# Patient Record
Sex: Male | Born: 1955 | Race: Black or African American | Hispanic: No | Marital: Single | Smoking: Former smoker
Health system: Southern US, Community
[De-identification: ages and names within clinical notes are randomized; demographics above are authoritative.]

## PROBLEM LIST (undated history)

## (undated) DIAGNOSIS — I10 Essential (primary) hypertension: Secondary | ICD-10-CM

## (undated) DIAGNOSIS — E785 Hyperlipidemia, unspecified: Secondary | ICD-10-CM

## (undated) DIAGNOSIS — K611 Rectal abscess: Secondary | ICD-10-CM

## (undated) DIAGNOSIS — Z86010 Personal history of colonic polyps: Secondary | ICD-10-CM

## (undated) DIAGNOSIS — S62509A Fracture of unspecified phalanx of unspecified thumb, initial encounter for closed fracture: Secondary | ICD-10-CM

## (undated) DIAGNOSIS — I2699 Other pulmonary embolism without acute cor pulmonale: Secondary | ICD-10-CM

## (undated) HISTORY — DX: Rectal abscess: K61.1

## (undated) HISTORY — PX: DENTAL RESTORATION/EXTRACTION WITH X-RAY: SHX5796

## (undated) HISTORY — DX: Essential (primary) hypertension: I10

## (undated) HISTORY — DX: Hyperlipidemia, unspecified: E78.5

## (undated) HISTORY — PX: ORIF FINGER / THUMB FRACTURE: SUR932

## (undated) HISTORY — DX: Personal history of colonic polyps: Z86.010

## (undated) HISTORY — DX: Fracture of unspecified phalanx of unspecified thumb, initial encounter for closed fracture: S62.509A

## (undated) HISTORY — DX: Other pulmonary embolism without acute cor pulmonale: I26.99

---

## 1998-12-27 ENCOUNTER — Encounter: Payer: Self-pay | Admitting: Family Medicine

## 2005-01-04 ENCOUNTER — Ambulatory Visit: Payer: Self-pay | Admitting: Family Medicine

## 2005-07-03 ENCOUNTER — Ambulatory Visit: Payer: Self-pay | Admitting: Family Medicine

## 2005-07-23 ENCOUNTER — Ambulatory Visit: Payer: Self-pay | Admitting: Internal Medicine

## 2005-10-02 ENCOUNTER — Ambulatory Visit: Payer: Self-pay | Admitting: Family Medicine

## 2005-11-13 ENCOUNTER — Ambulatory Visit: Payer: Self-pay | Admitting: Family Medicine

## 2006-07-04 ENCOUNTER — Ambulatory Visit: Payer: Self-pay | Admitting: Family Medicine

## 2006-07-04 LAB — CONVERTED CEMR LAB: PSA: 0.44 ng/mL

## 2006-07-08 ENCOUNTER — Ambulatory Visit: Payer: Self-pay | Admitting: Family Medicine

## 2006-08-01 ENCOUNTER — Ambulatory Visit: Payer: Self-pay | Admitting: Internal Medicine

## 2006-08-23 ENCOUNTER — Ambulatory Visit: Payer: Self-pay | Admitting: Family Medicine

## 2006-09-26 ENCOUNTER — Ambulatory Visit: Payer: Self-pay | Admitting: Internal Medicine

## 2006-09-30 ENCOUNTER — Ambulatory Visit: Payer: Self-pay | Admitting: Family Medicine

## 2006-11-27 ENCOUNTER — Ambulatory Visit: Payer: Self-pay | Admitting: Family Medicine

## 2006-12-03 ENCOUNTER — Ambulatory Visit: Payer: Self-pay | Admitting: Family Medicine

## 2007-03-04 ENCOUNTER — Ambulatory Visit: Payer: Self-pay | Admitting: Family Medicine

## 2007-04-02 ENCOUNTER — Encounter: Payer: Self-pay | Admitting: Family Medicine

## 2007-04-02 DIAGNOSIS — N400 Enlarged prostate without lower urinary tract symptoms: Secondary | ICD-10-CM

## 2007-04-02 DIAGNOSIS — I10 Essential (primary) hypertension: Secondary | ICD-10-CM | POA: Insufficient documentation

## 2007-04-02 DIAGNOSIS — N411 Chronic prostatitis: Secondary | ICD-10-CM

## 2007-04-02 DIAGNOSIS — E785 Hyperlipidemia, unspecified: Secondary | ICD-10-CM

## 2007-04-10 ENCOUNTER — Ambulatory Visit: Payer: Self-pay | Admitting: Family Medicine

## 2007-04-22 ENCOUNTER — Telehealth (INDEPENDENT_AMBULATORY_CARE_PROVIDER_SITE_OTHER): Payer: Self-pay | Admitting: *Deleted

## 2007-04-25 ENCOUNTER — Ambulatory Visit: Payer: Self-pay | Admitting: Family Medicine

## 2007-07-16 ENCOUNTER — Ambulatory Visit: Payer: Self-pay | Admitting: Family Medicine

## 2007-07-16 LAB — CONVERTED CEMR LAB
AST: 20 units/L (ref 0–37)
Albumin: 3.6 g/dL (ref 3.5–5.2)
Alkaline Phosphatase: 63 units/L (ref 39–117)
BUN: 10 mg/dL (ref 6–23)
CO2: 31 meq/L (ref 19–32)
Cholesterol: 190 mg/dL (ref 0–200)
Creatinine, Ser: 1 mg/dL (ref 0.4–1.5)
GFR calc Af Amer: 101 mL/min
GFR calc non Af Amer: 83 mL/min
Glucose, Bld: 97 mg/dL (ref 70–99)
HDL: 53.9 mg/dL (ref >39.0)
PSA: 0.67 ng/mL (ref 0.10–4.00)
Potassium: 3.9 meq/L (ref 3.5–5.1)
TSH: 1.06 microintl units/mL (ref 0.35–5.50)
Total Protein: 6.8 g/dL (ref 6.0–8.3)
VLDL: 35 mg/dL (ref 0–40)

## 2007-07-18 ENCOUNTER — Ambulatory Visit: Payer: Self-pay | Admitting: Family Medicine

## 2007-07-18 DIAGNOSIS — N529 Male erectile dysfunction, unspecified: Secondary | ICD-10-CM

## 2007-10-21 ENCOUNTER — Ambulatory Visit: Payer: Self-pay | Admitting: Family Medicine

## 2007-11-13 ENCOUNTER — Ambulatory Visit: Payer: Self-pay | Admitting: Family Medicine

## 2007-11-27 DIAGNOSIS — I2699 Other pulmonary embolism without acute cor pulmonale: Secondary | ICD-10-CM

## 2007-11-27 HISTORY — DX: Other pulmonary embolism without acute cor pulmonale: I26.99

## 2007-12-18 ENCOUNTER — Ambulatory Visit: Payer: Self-pay | Admitting: Family Medicine

## 2008-03-16 ENCOUNTER — Ambulatory Visit: Payer: Self-pay | Admitting: Family Medicine

## 2008-06-24 ENCOUNTER — Ambulatory Visit: Payer: Self-pay | Admitting: Family Medicine

## 2008-06-24 LAB — CONVERTED CEMR LAB
Basophils Relative: 0.3 % (ref 0.0–3.0)
Chloride: 101 meq/L (ref 96–112)
Cholesterol: 210 mg/dL (ref 0–200)
Direct LDL: 120.3 mg/dL
Eosinophils Absolute: 0.1 10*3/uL (ref 0.0–0.7)
HCT: 40.3 % (ref 39.0–52.0)
HDL: 55.2 mg/dL (ref >39.0)
Hemoglobin: 13.9 g/dL (ref 13.0–17.0)
Lymphocytes Relative: 32.4 % (ref 12.0–46.0)
MCHC: 34.6 g/dL (ref 30.0–36.0)
Monocytes Relative: 8 % (ref 3.0–12.0)
Platelets: 187 10*3/uL (ref 150–400)
RDW: 13.5 % (ref 11.5–14.6)
Total Bilirubin: 0.7 mg/dL (ref 0.3–1.2)
Triglycerides: 164 mg/dL — ABNORMAL HIGH (ref 0–149)

## 2008-07-20 ENCOUNTER — Ambulatory Visit: Payer: Self-pay | Admitting: Family Medicine

## 2008-07-20 DIAGNOSIS — R7303 Prediabetes: Secondary | ICD-10-CM | POA: Insufficient documentation

## 2008-08-05 ENCOUNTER — Ambulatory Visit: Payer: Self-pay | Admitting: Family Medicine

## 2008-08-05 LAB — CONVERTED CEMR LAB: Glucose, Bld: 101 mg/dL — ABNORMAL HIGH (ref 70–99)

## 2008-08-12 ENCOUNTER — Ambulatory Visit: Payer: Self-pay | Admitting: Family Medicine

## 2008-08-12 LAB — CONVERTED CEMR LAB: OCCULT 1: NEGATIVE

## 2008-08-16 ENCOUNTER — Encounter (INDEPENDENT_AMBULATORY_CARE_PROVIDER_SITE_OTHER): Payer: Self-pay | Admitting: *Deleted

## 2008-10-03 ENCOUNTER — Emergency Department (HOSPITAL_COMMUNITY): Admission: EM | Admit: 2008-10-03 | Discharge: 2008-10-03 | Payer: Self-pay | Admitting: Emergency Medicine

## 2008-10-13 ENCOUNTER — Ambulatory Visit: Payer: Self-pay | Admitting: Family Medicine

## 2008-10-13 ENCOUNTER — Ambulatory Visit: Payer: Self-pay

## 2008-10-13 ENCOUNTER — Ambulatory Visit: Payer: Self-pay | Admitting: Internal Medicine

## 2008-10-13 DIAGNOSIS — R609 Edema, unspecified: Secondary | ICD-10-CM

## 2008-10-13 DIAGNOSIS — R600 Localized edema: Secondary | ICD-10-CM | POA: Insufficient documentation

## 2008-10-13 DIAGNOSIS — Z86718 Personal history of other venous thrombosis and embolism: Secondary | ICD-10-CM | POA: Insufficient documentation

## 2008-10-13 DIAGNOSIS — Z86711 Personal history of pulmonary embolism: Secondary | ICD-10-CM

## 2008-10-14 ENCOUNTER — Ambulatory Visit: Payer: Self-pay | Admitting: Internal Medicine

## 2008-10-14 ENCOUNTER — Inpatient Hospital Stay (HOSPITAL_COMMUNITY): Admission: EM | Admit: 2008-10-14 | Discharge: 2008-10-21 | Payer: Self-pay | Admitting: Emergency Medicine

## 2008-10-14 ENCOUNTER — Encounter (INDEPENDENT_AMBULATORY_CARE_PROVIDER_SITE_OTHER): Payer: Self-pay | Admitting: Internal Medicine

## 2008-10-21 ENCOUNTER — Encounter: Payer: Self-pay | Admitting: Internal Medicine

## 2008-10-21 ENCOUNTER — Encounter: Payer: Self-pay | Admitting: Family Medicine

## 2008-10-25 ENCOUNTER — Ambulatory Visit: Payer: Self-pay | Admitting: Family Medicine

## 2008-10-25 LAB — CONVERTED CEMR LAB
INR: 2.2
Prothrombin Time: 18.1 s

## 2008-10-29 ENCOUNTER — Ambulatory Visit: Payer: Self-pay | Admitting: Internal Medicine

## 2008-11-02 ENCOUNTER — Ambulatory Visit: Payer: Self-pay | Admitting: Family Medicine

## 2008-11-02 LAB — CONVERTED CEMR LAB: INR: 2.9

## 2008-11-09 ENCOUNTER — Ambulatory Visit: Payer: Self-pay | Admitting: Family Medicine

## 2008-11-09 LAB — CONVERTED CEMR LAB: Prothrombin Time: 20.4 s

## 2008-11-23 ENCOUNTER — Ambulatory Visit: Payer: Self-pay | Admitting: Family Medicine

## 2008-11-23 LAB — CONVERTED CEMR LAB: Prothrombin Time: 19 s

## 2008-11-30 ENCOUNTER — Ambulatory Visit: Payer: Self-pay | Admitting: Family Medicine

## 2008-12-21 ENCOUNTER — Ambulatory Visit: Payer: Self-pay | Admitting: Family Medicine

## 2008-12-21 LAB — CONVERTED CEMR LAB
INR: 3
Prothrombin Time: 20.9 s

## 2009-01-18 ENCOUNTER — Ambulatory Visit: Payer: Self-pay | Admitting: Family Medicine

## 2009-01-18 LAB — CONVERTED CEMR LAB: INR: 2.6

## 2009-02-15 ENCOUNTER — Ambulatory Visit: Payer: Self-pay | Admitting: Family Medicine

## 2009-02-15 LAB — CONVERTED CEMR LAB
INR: 3.1
Prothrombin Time: 21.1 s

## 2009-03-01 ENCOUNTER — Encounter: Payer: Self-pay | Admitting: Family Medicine

## 2009-03-15 ENCOUNTER — Ambulatory Visit: Payer: Self-pay | Admitting: Family Medicine

## 2009-03-22 ENCOUNTER — Ambulatory Visit: Payer: Self-pay | Admitting: Family Medicine

## 2009-03-22 LAB — CONVERTED CEMR LAB: Prothrombin Time: 17.3 s

## 2009-03-29 ENCOUNTER — Encounter: Payer: Self-pay | Admitting: Family Medicine

## 2009-03-29 ENCOUNTER — Ambulatory Visit: Payer: Self-pay

## 2009-03-29 HISTORY — PX: OTHER SURGICAL HISTORY: SHX169

## 2009-04-04 ENCOUNTER — Telehealth: Payer: Self-pay | Admitting: Family Medicine

## 2009-04-12 ENCOUNTER — Ambulatory Visit: Payer: Self-pay | Admitting: Family Medicine

## 2009-05-10 ENCOUNTER — Ambulatory Visit: Payer: Self-pay | Admitting: Family Medicine

## 2009-05-10 LAB — CONVERTED CEMR LAB
INR: 3.8
Prothrombin Time: 23.6 s

## 2009-05-24 ENCOUNTER — Ambulatory Visit: Payer: Self-pay | Admitting: Family Medicine

## 2009-05-24 LAB — CONVERTED CEMR LAB: Prothrombin Time: 18.7 s

## 2009-06-07 ENCOUNTER — Ambulatory Visit: Payer: Self-pay | Admitting: Family Medicine

## 2009-06-07 LAB — CONVERTED CEMR LAB
INR: 3 — ABNORMAL HIGH (ref 0.8–1.0)
Prothrombin Time: 20.1 s
Prothrombin Time: 30.2 s — ABNORMAL HIGH (ref 10.9–13.3)

## 2009-07-06 ENCOUNTER — Ambulatory Visit: Payer: Self-pay | Admitting: Family Medicine

## 2009-07-06 LAB — CONVERTED CEMR LAB
INR: 3
Prothrombin Time: 20.8 s

## 2009-07-20 ENCOUNTER — Ambulatory Visit: Payer: Self-pay | Admitting: Family Medicine

## 2009-07-20 LAB — CONVERTED CEMR LAB
AST: 15 units/L (ref 0–37)
Albumin: 3.8 g/dL (ref 3.5–5.2)
Alkaline Phosphatase: 58 units/L (ref 39–117)
Basophils Relative: 0.2 % (ref 0.0–3.0)
Bilirubin, Direct: 0 mg/dL (ref 0.0–0.3)
CO2: 30 meq/L (ref 19–32)
Calcium: 8.7 mg/dL (ref 8.4–10.5)
Creatinine, Ser: 0.8 mg/dL (ref 0.4–1.5)
Creatinine,U: 97.9 mg/dL
Glucose, Bld: 94 mg/dL (ref 70–99)
HDL: 65.4 mg/dL (ref >39.00)
LDL Cholesterol: 100 mg/dL — ABNORMAL HIGH (ref 0–99)
Lymphocytes Relative: 37.3 % (ref 12.0–46.0)
Lymphs Abs: 2.6 10*3/uL (ref 0.7–4.0)
MCHC: 35 g/dL (ref 30.0–36.0)
Microalb Creat Ratio: 2 mg/g (ref 0.0–30.0)
Microalb, Ur: 0.2 mg/dL (ref 0.0–1.9)
Monocytes Relative: 8 % (ref 3.0–12.0)
Neutro Abs: 3.7 10*3/uL (ref 1.4–7.7)
Potassium: 4 meq/L (ref 3.5–5.1)
Total CHOL/HDL Ratio: 3
Total Protein: 7.4 g/dL (ref 6.0–8.3)

## 2009-07-27 ENCOUNTER — Ambulatory Visit: Payer: Self-pay | Admitting: Family Medicine

## 2009-07-27 LAB — CONVERTED CEMR LAB
INR: 1.7
Prothrombin Time: 16.2 s

## 2009-08-09 ENCOUNTER — Ambulatory Visit: Payer: Self-pay | Admitting: Family Medicine

## 2009-08-09 LAB — CONVERTED CEMR LAB
INR: 2.7
Prothrombin Time: 19.9 s

## 2009-09-06 ENCOUNTER — Ambulatory Visit: Payer: Self-pay | Admitting: Family Medicine

## 2009-09-06 LAB — CONVERTED CEMR LAB: Prothrombin Time: 19.5 s

## 2009-09-20 ENCOUNTER — Ambulatory Visit: Payer: Self-pay | Admitting: Family Medicine

## 2009-09-20 LAB — CONVERTED CEMR LAB: Prothrombin Time: 17.9 s

## 2009-10-19 ENCOUNTER — Ambulatory Visit: Payer: Self-pay | Admitting: Family Medicine

## 2009-10-19 LAB — CONVERTED CEMR LAB
OCCULT 2: NEGATIVE
OCCULT 3: NEGATIVE

## 2009-10-25 ENCOUNTER — Ambulatory Visit: Payer: Self-pay | Admitting: Family Medicine

## 2009-10-26 HISTORY — PX: OTHER SURGICAL HISTORY: SHX169

## 2009-11-08 ENCOUNTER — Ambulatory Visit: Payer: Self-pay | Admitting: Family Medicine

## 2009-11-10 ENCOUNTER — Encounter: Payer: Self-pay | Admitting: Family Medicine

## 2009-11-11 ENCOUNTER — Encounter: Payer: Self-pay | Admitting: Family Medicine

## 2009-11-11 ENCOUNTER — Ambulatory Visit: Payer: Self-pay

## 2009-11-16 ENCOUNTER — Telehealth: Payer: Self-pay | Admitting: Family Medicine

## 2010-02-01 ENCOUNTER — Ambulatory Visit: Payer: Self-pay | Admitting: Family Medicine

## 2010-02-02 ENCOUNTER — Encounter: Payer: Self-pay | Admitting: Family Medicine

## 2010-03-14 ENCOUNTER — Encounter: Payer: Self-pay | Admitting: Cardiovascular Disease

## 2010-06-01 IMAGING — CR DG CHEST 2V
2 series · 2 of 2 positions shown · non-contrast
Comparison: None

CLINICAL DATA: Fever, shortness of breath and cough.  Rectal
bleeding.

CHEST - 2 VIEW

[w chest pa]
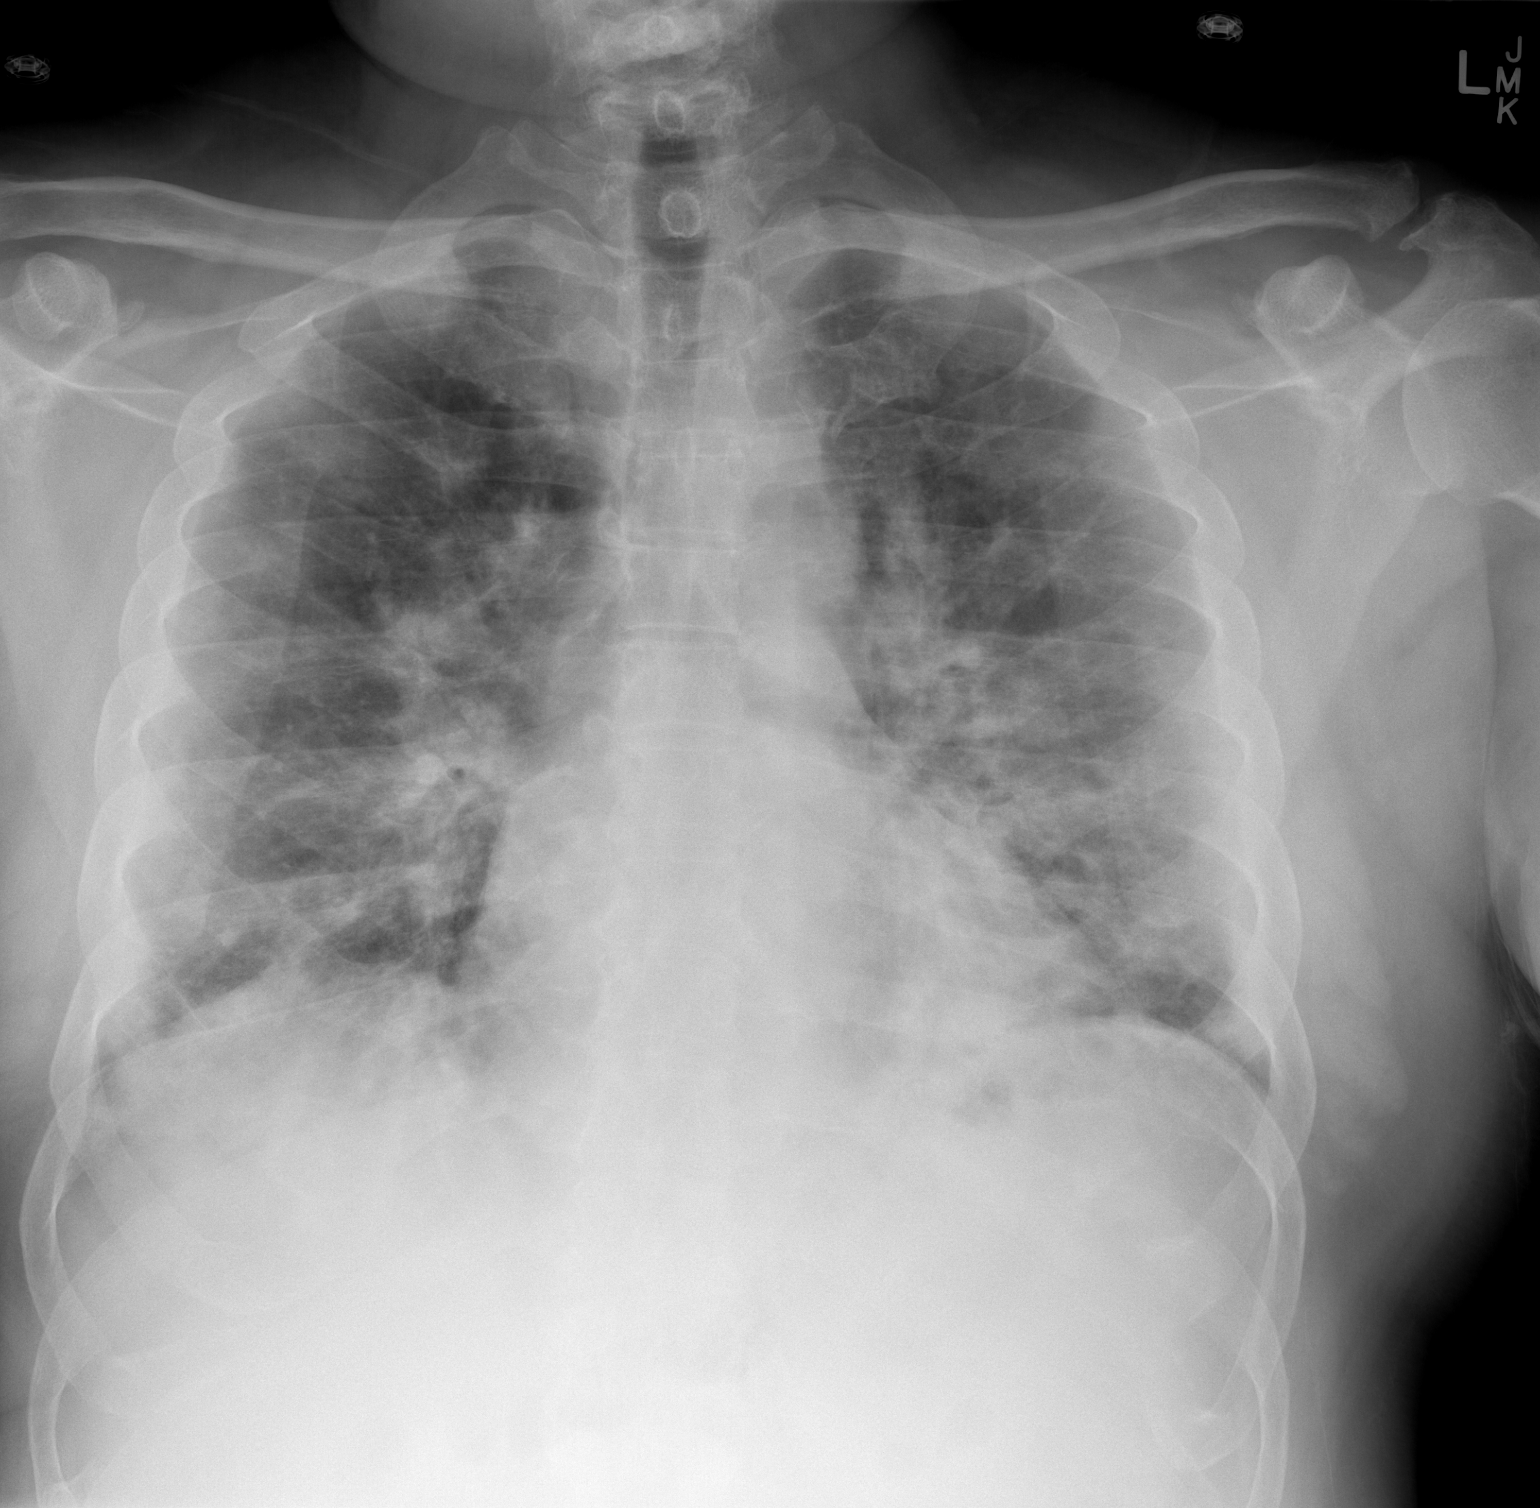

[w chest lat]
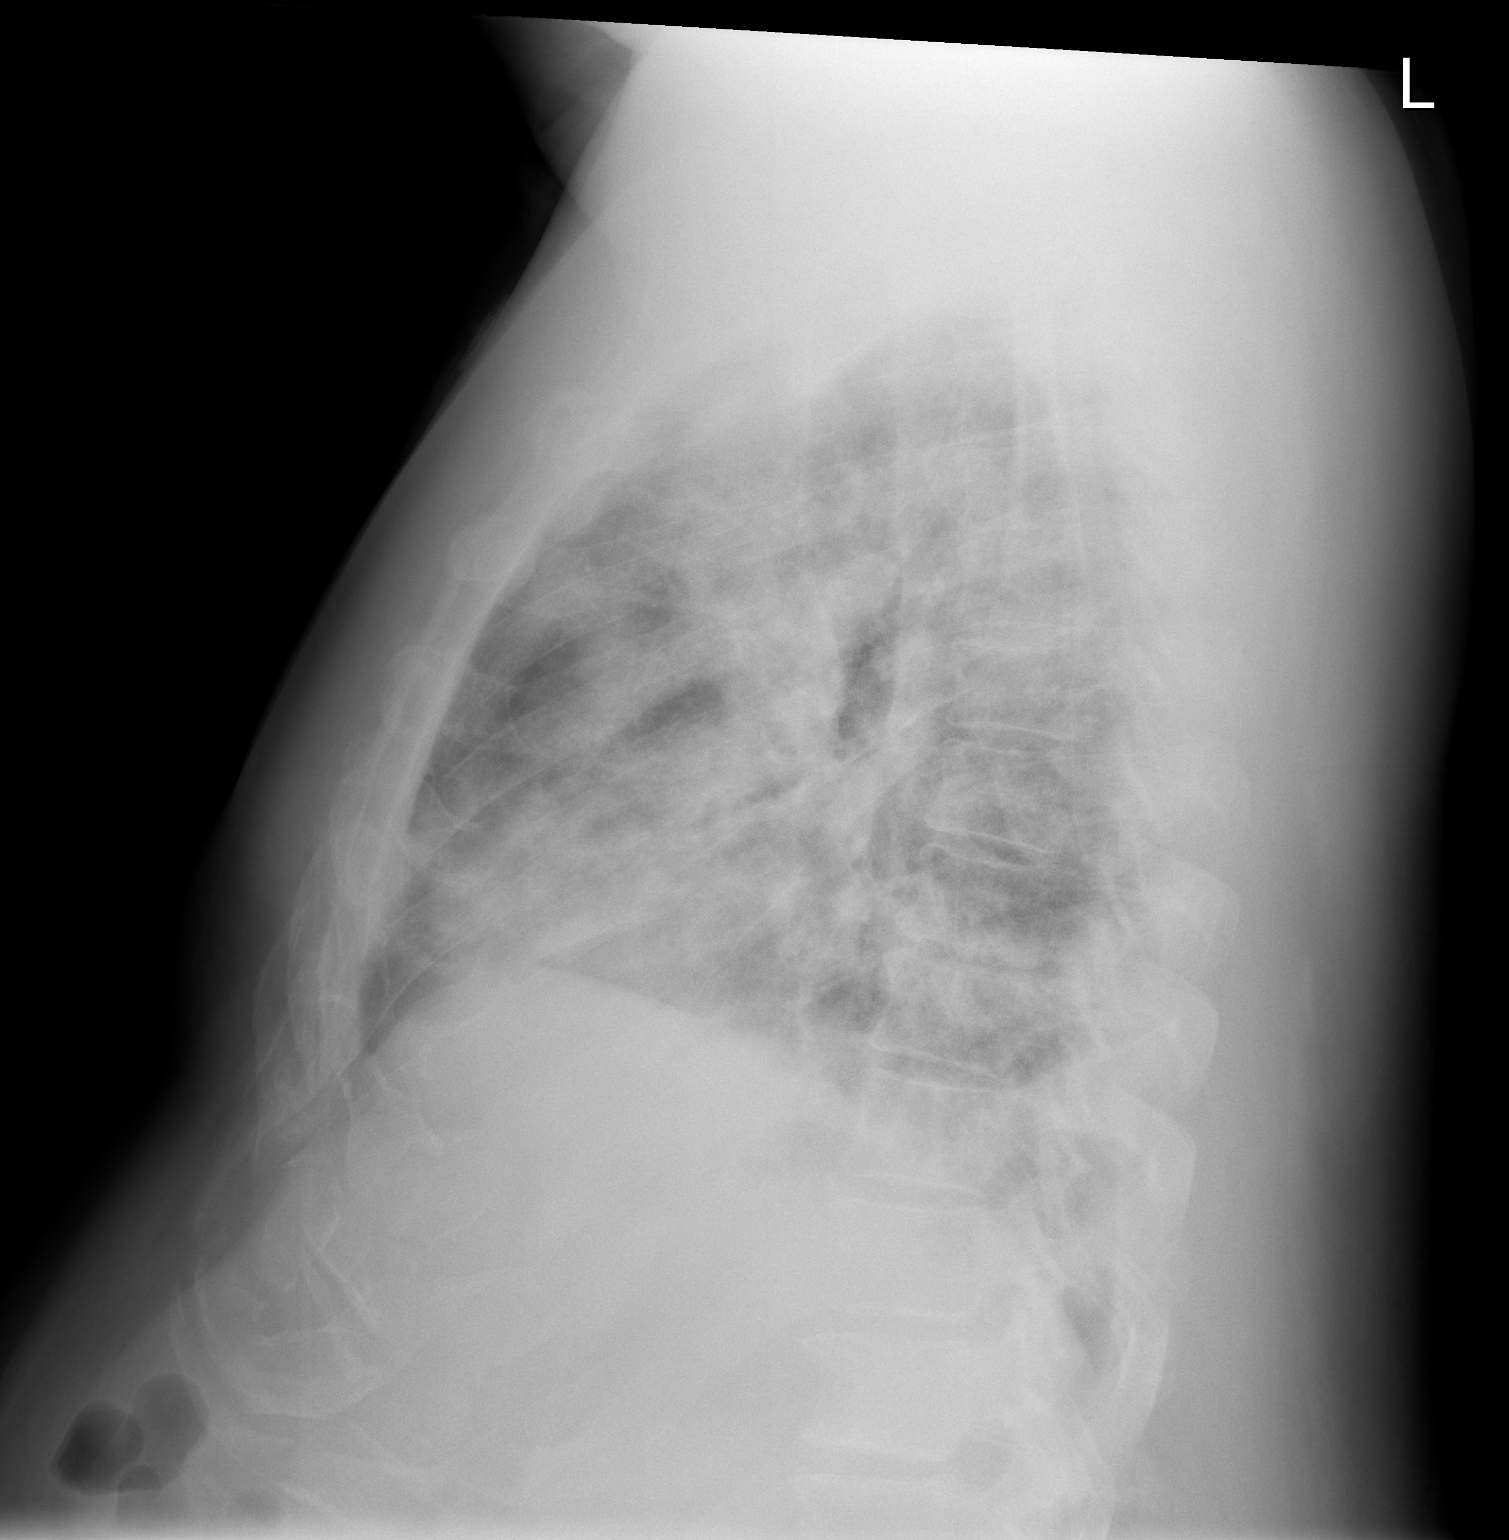

[2 of 2 positions shown; findings below may reference images not displayed]

FINDINGS: Severe pattern of bilateral pulmonary airspace disease
present.  Without prior studies, it is unclear how much of this is
chronic.  Some the airspace disease is somewhat nodular and
component of underlying pulmonary nodules are not excluded.  There
also may be a component of pneumonia especially in both lower lung
zones.  No pleural fluid.  Heart size is normal.
IMPRESSION: Diffuse pulmonary airspace disease with a somewhat nodular pattern.
Component of infection cannot be excluded.

## 2010-06-27 IMAGING — CT CT ABDOMEN W/ CM
2 of 7 series · 16 of 46 positions shown, 18 images · IV contrast (agent unspecified)
Comparison: None

CT ABDOMEN

CLINICAL DATA: Intra-abdominal source for DVT.  Hypercoagulable.
Evaluate for malignancy.

CT ABDOMEN AND PELVIS WITH CONTRAST
TECHNIQUE: Multidetector CT imaging of the abdomen and pelvis was
performed using the standard protocol following bolus
administration of intravenous contrast.
Contrast: 125 ml Cmnipaque-FEE

[Series 3: kidney_delay 5.0 b10f st · axial · 0.90mm/px · z∈[-468,-48]mm · 13 of 100 slices shown, 15 images]
[im 8/100  soft-tissue]
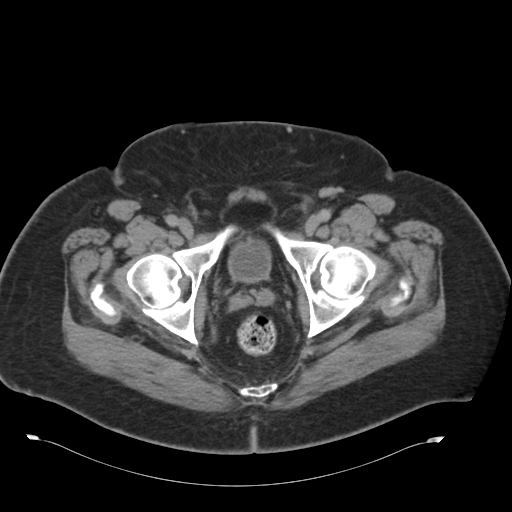
[im 8/100  bone]
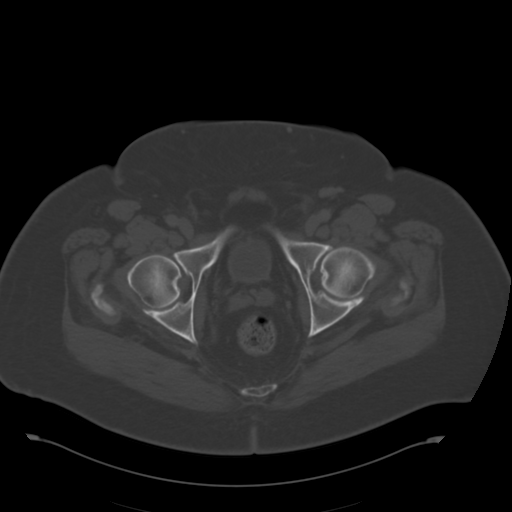
[im 15/100  soft-tissue]
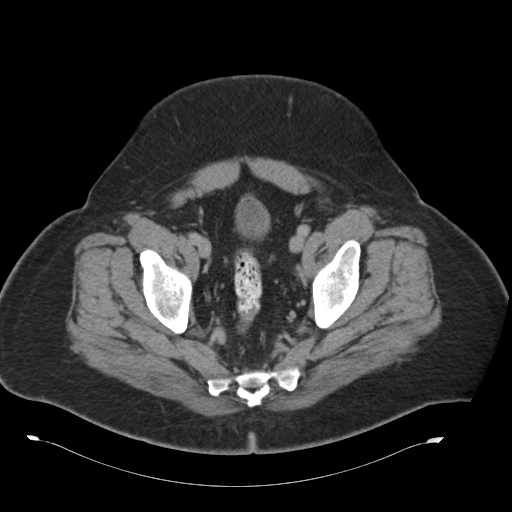
[im 22/100  soft-tissue]
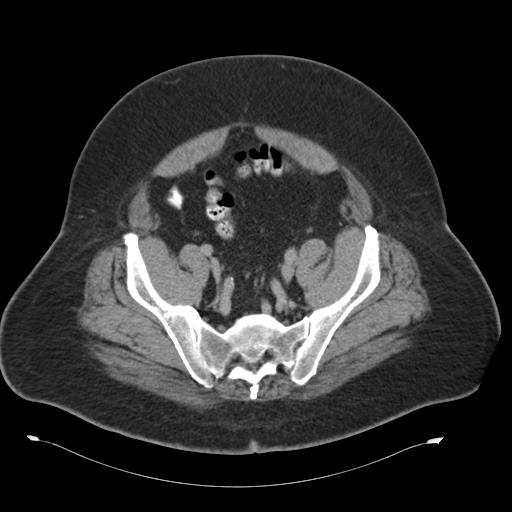
[im 29/100  soft-tissue]
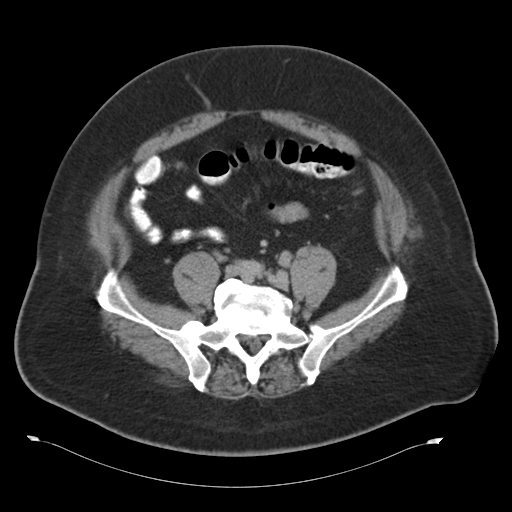
[im 36/100  soft-tissue]
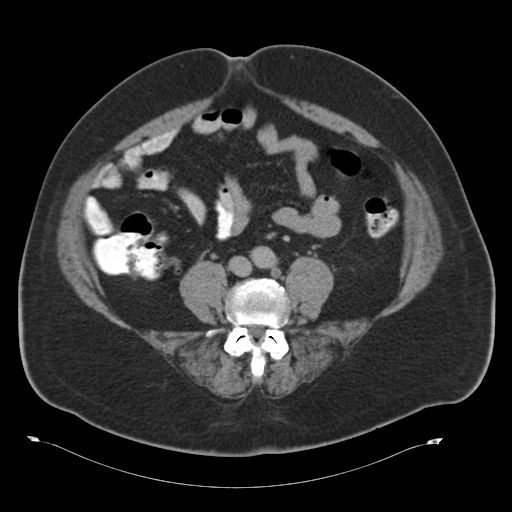
[im 43/100  soft-tissue]
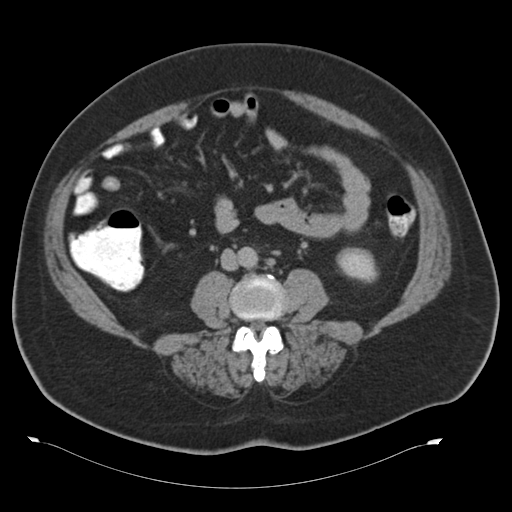
[im 50/100  soft-tissue]
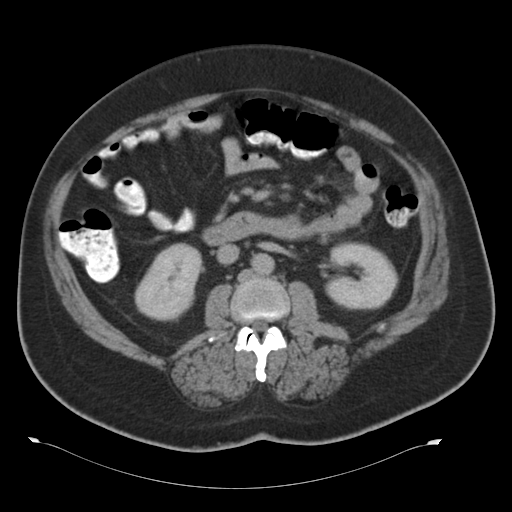
[im 57/100  soft-tissue]
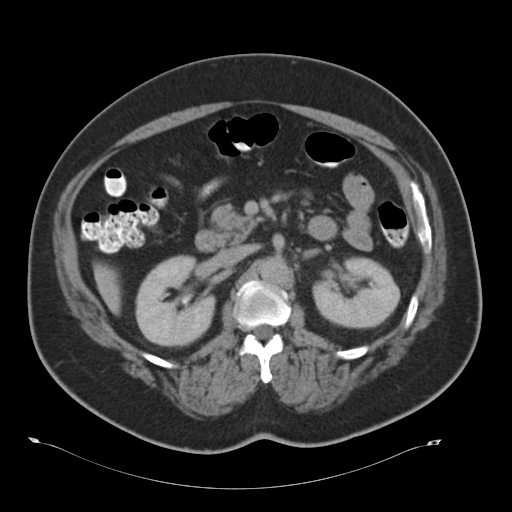
[im 64/100  soft-tissue]
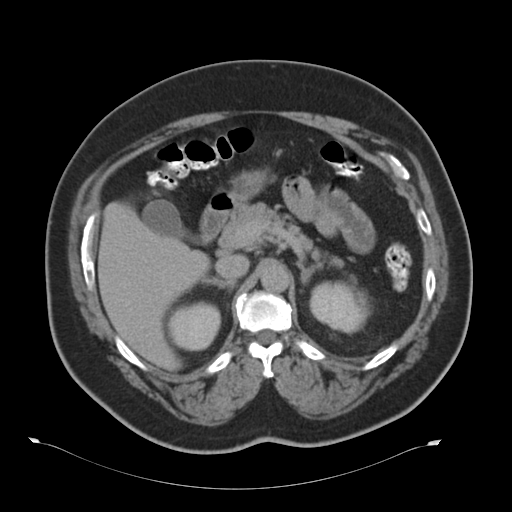
[im 64/100  bone]
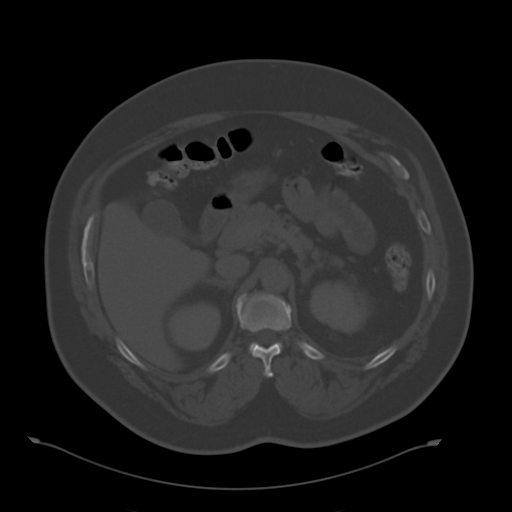
[im 71/100  soft-tissue]
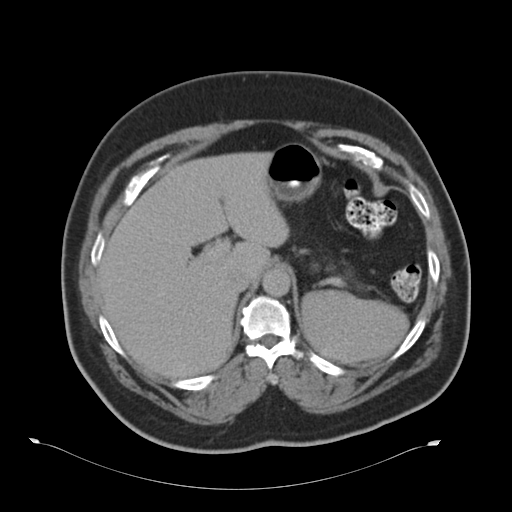
[im 78/100  soft-tissue]
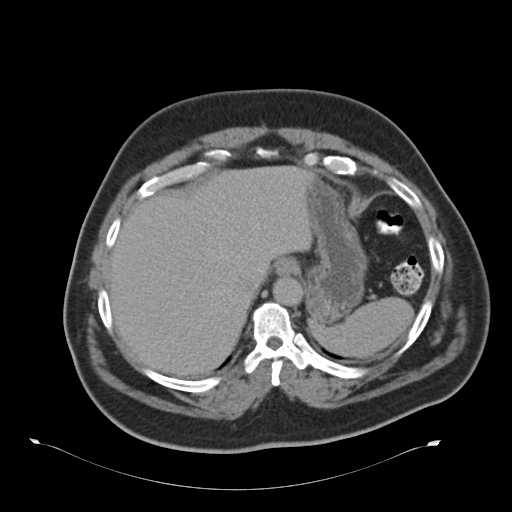
[im 85/100  soft-tissue]
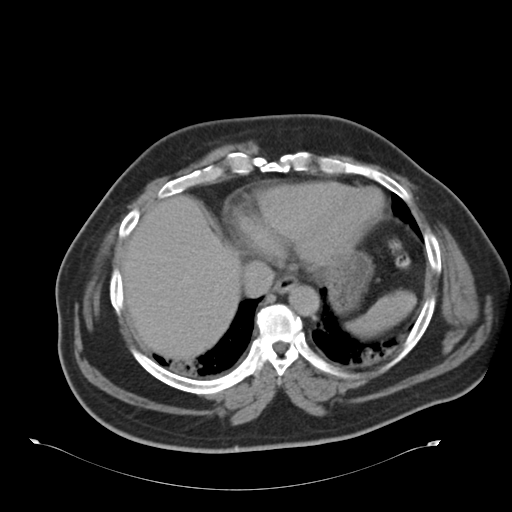
[im 92/100  soft-tissue]
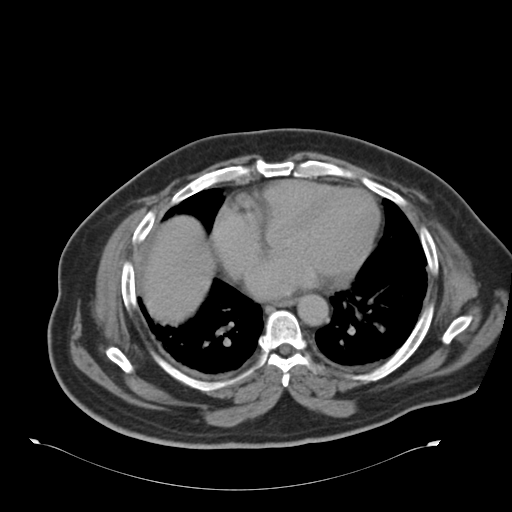

[Series 602: <mpr range> · coronal · 1.02mm/px · 3 of 67 slices shown]
[im 23/67  soft-tissue]
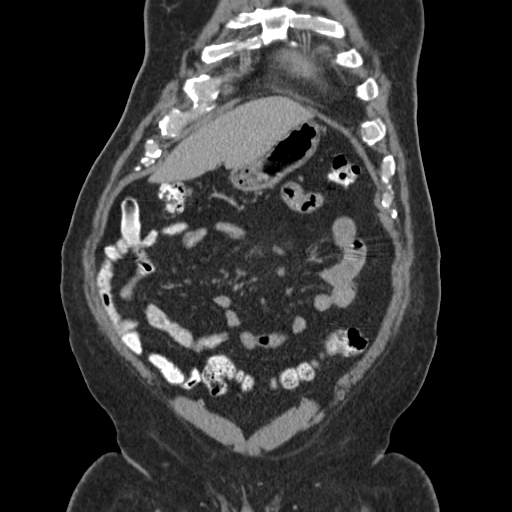
[im 30/67  soft-tissue]
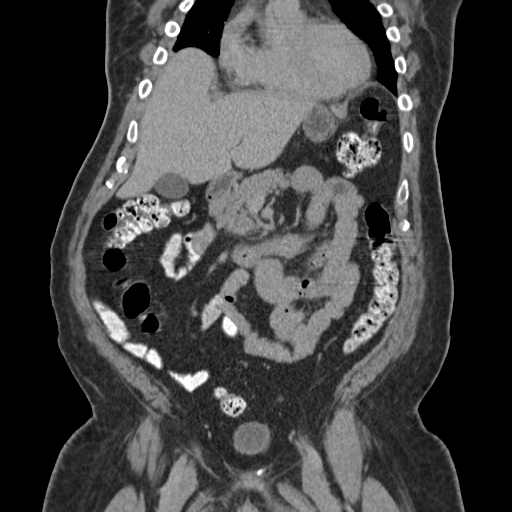
[im 37/67  soft-tissue]
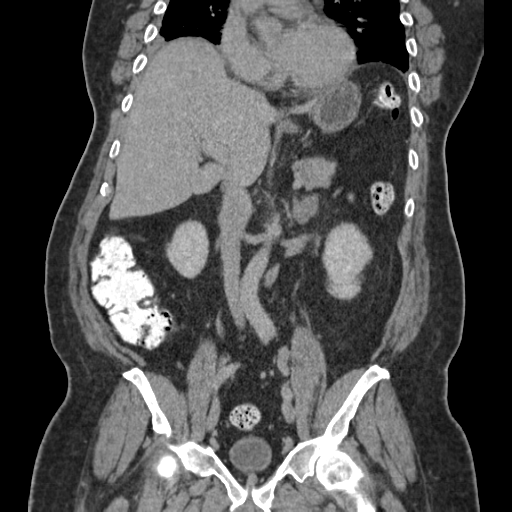

[16 of 46 positions shown; findings below may reference images not displayed]

FINDINGS: The lung bases demonstrate bibasilar subpleural
atelectasis.  No pleural effusion.  Heart is mildly enlarged.  No
pericardial effusion.

The liver is unremarkable.  No masses or biliary dilatation.  The
gallbladder appears normal.  The spleen is normal in size.  The
pancreas demonstrates no abnormalities.  The adrenal glands and
kidneys are unremarkable.

The stomach, duodenum, small bowel and colon demonstrate no
significant abnormalities.  No mesenteric or retroperitoneal masses
or adenopathy. The aorta is normal in caliber.

No significant bony findings.
IMPRESSION: 1.  No acute abdominal findings, mass lesions or adenopathy.
2.  Cardiac enlargement.
3.  Bibasilar atelectasis.

CT PELVIS
FINDINGS: The rectum, sigmoid colon and visualized small bowel
loops are unremarkable.  The appendix is visualized and is normal.
The bladder, seminal vesicles and prostate gland are unremarkable.
No pelvic masses, adenopathy or free pelvic fluid collections.  The
major vascular structures appear normal.  The bony pelvis is
intact.  SI joint degenerative changes are noted.  No inguinal
mass, hernia or adenopathy.
IMPRESSION: No acute pelvic findings, mass lesions or adenopathy.

## 2010-07-03 ENCOUNTER — Encounter (INDEPENDENT_AMBULATORY_CARE_PROVIDER_SITE_OTHER): Payer: Self-pay | Admitting: *Deleted

## 2010-12-26 NOTE — Consult Note (Signed)
Summary: Dr.G.Scott Dean,[facility],Note  Dr.G.Scott Dean,[facility],Note   Imported By: Beau Fanny 02/07/2010 15:03:15  _____________________________________________________________________  External Attachment:    Type:   Image     Comment:   External Document

## 2010-12-26 NOTE — Assessment & Plan Note (Signed)
Summary: RIGHT KNEE PAIN/CLE   Vital Signs:  Patient profile:   55 year old male Weight:      302.50 pounds Temp:     98.9 degrees F oral Pulse rate:   68 / minute Pulse rhythm:   regular BP sitting:   132 / 74  (left arm) Cuff size:   large  Vitals Entered By: Sydell Axon LPN (February 01, 1609 10:13 AM) CC: Right knee pain   History of Present Illness: Pt here for right knee pain...he has had knee pain before. He was diagnosed by me with collat lig strain in Jan and told conservative things to try. He was on Coumadin then and could not take NSAIDS. That has since been stopped. 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  Problems Prior to Update: 1)  Knee Sprain, R Medial Collateral Ligament  (ICD-844.1) 2)  Encounter For Therapeutic Drug Monitoring  (ICD-V58.83) 3)  Encounter For Long-term Use of Anticoagulants  (ICD-V58.61) 4)  Ac Venus Embo & Thromb Unspec Deep Ves Lower Ext  (ICD-453.40) 5)  Pulmonary Embolism  (ICD-415.19) 6)  Edema  (ICD-782.3) 7)  Special Screening Malig Neoplasms Other Sites  (ICD-V76.49) 8)  Hyperglycemia  (ICD-790.29) 9)  Health Maintenance Exam  (ICD-V70.0) 10)  Obesity Nos  (ICD-278.00) 11)  Erectile Dysfunction, Organic  (ICD-607.84) 12)  Prostatitis, Chronic  (ICD-601.1) 13)  Hypercholesterolemia  (ICD-272.0) 14)  Benign Prostatic Hypertrophy, Hx of  (ICD-V13.8) 15)  Hypertension  (ICD-401.9)  Medications Prior to Update: 1)  Norvasc 10 Mg Tabs (Amlodipine Besylate) .... Take One By Mouth Noon 2)  Pravachol 80 Mg Tabs (Pravastatin Sodium) .... Take One By Mouth At Bedtime 3)  Viagra 100 Mg  Tabs (Sildenafil Citrate) .... One Tab By Mouth Onbe Hour Prior 4)  Cardura 4 Mg  Tabs (Doxazosin Mesylate) .... One Tab By Mouth At Night. 5)  Warfarin Sodium 10 Mg Tabs (Warfarin Sodium) .Marland Kitchen.. 1 Daily As Directed Per Protime Results  Allergies: No Known Drug Allergies  Physical Exam  General:   Well-developed,well-nourished,in no acute distress; alert,appropriate and cooperative throughout examination, mildly obese. Head:  Normocephalic and atraumatic without obvious abnormalities. No apparent alopecia but some male pattern  balding. Eyes:  Conjunctiva clear bilaterally.  Ears:  External ear exam shows no significant lesions or deformities.  Otoscopic examination reveals clear canals, tympanic membranes are intact bilaterally without bulging, retraction, inflammation or discharge. Hearing is grossly normal bilaterally. Nose:  External nasal examination shows no deformity or inflammation. Nasal mucosa are pink and moist without lesions or exudates. Mouth:  Oral mucosa and oropharynx without lesions or exudates.  Teeth in good repair. Neck:  No deformities, masses, or tenderness noted. Chest Wall:  No deformities, masses, tenderness or gynecomastia noted. Lungs:  Normal respiratory effort, chest expands symmetrically. Lungs are clear to auscultation, no crackles or wheezes. Good breath sounds and air exchange to the periphery bilat. Heart:  Normal rate and regular rhythm. S1 and S2 normal without gallop, murmur, click, rub or other extra sounds. Msk:  R knee tender over posterior portin of right medial collat ligament origin to insertion. Minimal swelling, no echymosis or warmth or redness.   Impression & Recommendations:  Problem # 1:  KNEE SPRAIN, R MEDIAL COLLATERAL LIGAMENT (ICD-844.1) Assessment Unchanged No impr with conservative trmt.has been using heat and ice and taking Tyl. Change to Aleve 3 tabs after brfst and 2 tabs after supper. Will refer to Ortho. Orders:  Orthopedic Referral (Ortho)  Problem # 2:  HYPERTENSION (ICD-401.9) Assessment: Unchanged Will need to keep an eye on this with NSAIDS. His updated medication list for this problem includes:    Norvasc 10 Mg Tabs (Amlodipine besylate) .Marland Kitchen... Take one by mouth noon    Cardura 4 Mg Tabs (Doxazosin mesylate) ..... One  tab by mouth at night.  BP today: 132/74 Prior BP: 130/78 (11/08/2009)  Labs Reviewed: K+: 4.0 (07/20/2009) Creat: : 0.8 (07/20/2009)   Chol: 185 (07/20/2009)   HDL: 65.40 (07/20/2009)   LDL: 100 (07/20/2009)   TG: 97.0 (07/20/2009)  Complete Medication List: 1)  Norvasc 10 Mg Tabs (Amlodipine besylate) .... Take one by mouth noon 2)  Pravachol 80 Mg Tabs (Pravastatin sodium) .... Take one by mouth at bedtime 3)  Viagra 100 Mg Tabs (Sildenafil citrate) .... One tab by mouth onbe hour prior 4)  Cardura 4 Mg Tabs (Doxazosin mesylate) .... One tab by mouth at night.  Patient Instructions: 1)  Refer to Ortho in [place]  Current Allergies (reviewed today): No known allergies

## 2010-12-26 NOTE — Letter (Signed)
Summary: Philip Middleton letter  Philip Middleton at [facility]  [address] [place], [place] [address]   Phone: [phone]  Fax: [phone]       07/03/2010 MRN: [id number]  [name] [address] [place], [place]  [address]  Dear [name],  [facility], and [facility] announce the retirement of Arta Silence, M.D., from full-time practice at the [facility] office effective May 25, 2010 and his plans of returning part-time.  It is important to Dr. Hetty Ely and to our practice that you understand that [facility] has seven physicians in our office for your health care needs.  We will continue to offer the same exceptional care that you have today.    Dr. Hetty Ely has spoken to many of you about his plans for retirement and returning part-time in the fall.   We will continue to work with you through the transition to schedule appointments for you in the office and meet the high standards that [facility] is committed to.   Again, it is with great pleasure that we share the news that Dr. Hetty Ely will return to [facility] in October of 2011 with a reduced schedule.    If you have any questions, or would like to request an appointment with one of our physicians, please call us at [phone] and press the option for Scheduling an appointment.  We take pleasure in providing you with excellent patient care and look forward to seeing you at your next office visit.  Our [facility] Physicians are:  Tillman Abide, M.D. Laurita Quint, M.D. Roxy Manns, M.D. Kerby Nora, M.D. Hannah Beat, M.D. Ruthe Mannan, M.D. We proudly welcomed Raechel Ache, M.D. and Eustaquio Boyden, M.D. to the practice in July/August 2011.  Sincerely,  [facility]

## 2010-12-26 NOTE — Consult Note (Signed)
Summary: Dr.G.Scott Dean,[facility],Note  Dr.G.Scott Dean,Orthopaedics,Note   Imported By: Beau Fanny 02/07/2010 15:01:49  _____________________________________________________________________  External Attachment:    Type:   Image     Comment:   External Document

## 2010-12-27 ENCOUNTER — Other Ambulatory Visit: Payer: Self-pay | Admitting: Family Medicine

## 2010-12-27 ENCOUNTER — Encounter (INDEPENDENT_AMBULATORY_CARE_PROVIDER_SITE_OTHER): Payer: Self-pay | Admitting: *Deleted

## 2010-12-27 ENCOUNTER — Other Ambulatory Visit (INDEPENDENT_AMBULATORY_CARE_PROVIDER_SITE_OTHER): Payer: Self-pay

## 2010-12-27 ENCOUNTER — Ambulatory Visit: Admit: 2010-12-27 | Payer: Self-pay | Admitting: Family Medicine

## 2010-12-27 DIAGNOSIS — N411 Chronic prostatitis: Secondary | ICD-10-CM

## 2010-12-27 DIAGNOSIS — R7309 Other abnormal glucose: Secondary | ICD-10-CM

## 2010-12-27 DIAGNOSIS — E7800 Pure hypercholesterolemia, unspecified: Secondary | ICD-10-CM

## 2010-12-27 LAB — RENAL FUNCTION PANEL
CO2: 31 mEq/L (ref 19–32)
Calcium: 9.1 mg/dL (ref 8.4–10.5)
Chloride: 104 mEq/L (ref 96–112)
Glucose, Bld: 105 mg/dL — ABNORMAL HIGH (ref 70–99)
Phosphorus: 2.5 mg/dL (ref 2.3–4.6)
Sodium: 140 mEq/L (ref 135–145)

## 2010-12-27 LAB — CBC WITH DIFFERENTIAL/PLATELET
Basophils Absolute: 0 10*3/uL (ref 0.0–0.1)
Basophils Relative: 0.5 % (ref 0.0–3.0)
Eosinophils Absolute: 0.1 10*3/uL (ref 0.0–0.7)
Eosinophils Relative: 1.3 % (ref 0.0–5.0)
Lymphocytes Relative: 29.2 % (ref 12.0–46.0)
MCHC: 34 g/dL (ref 30.0–36.0)
Monocytes Absolute: 0.6 10*3/uL (ref 0.1–1.0)
Neutro Abs: 4.6 10*3/uL (ref 1.4–7.7)
Neutrophils Relative %: 61.5 % (ref 43.0–77.0)
Platelets: 179 10*3/uL (ref 150.0–400.0)

## 2010-12-27 LAB — LIPID PANEL
Total CHOL/HDL Ratio: 2
Triglycerides: 117 mg/dL (ref 0.0–149.0)

## 2010-12-27 LAB — MICROALBUMIN / CREATININE URINE RATIO
Creatinine,U: 319 mg/dL
Microalb Creat Ratio: 0.3 mg/g (ref 0.0–30.0)
Microalb, Ur: 0.8 mg/dL (ref 0.0–1.9)

## 2010-12-27 LAB — TSH: TSH: 0.44 u[IU]/mL (ref 0.35–5.50)

## 2010-12-27 LAB — HEPATIC FUNCTION PANEL
AST: 16 U/L (ref 0–37)
Total Protein: 6.8 g/dL (ref 6.0–8.3)

## 2010-12-27 LAB — PSA: PSA: 0.64 ng/mL (ref 0.10–4.00)

## 2011-01-03 ENCOUNTER — Encounter: Payer: Self-pay | Admitting: Family Medicine

## 2011-01-03 ENCOUNTER — Encounter (INDEPENDENT_AMBULATORY_CARE_PROVIDER_SITE_OTHER): Payer: BC Managed Care – PPO | Admitting: Family Medicine

## 2011-01-03 DIAGNOSIS — Z23 Encounter for immunization: Secondary | ICD-10-CM

## 2011-01-03 DIAGNOSIS — Z0000 Encounter for general adult medical examination without abnormal findings: Secondary | ICD-10-CM

## 2011-01-05 ENCOUNTER — Encounter: Payer: Self-pay | Admitting: Cardiovascular Disease

## 2011-01-05 ENCOUNTER — Institutional Professional Consult (permissible substitution) (INDEPENDENT_AMBULATORY_CARE_PROVIDER_SITE_OTHER): Payer: BC Managed Care – PPO | Admitting: Cardiovascular Disease

## 2011-01-05 DIAGNOSIS — M79609 Pain in unspecified limb: Secondary | ICD-10-CM

## 2011-01-11 NOTE — Assessment & Plan Note (Signed)
Summary: CPX/ CLE   Vital Signs:  Patient profile:   55 year old male Weight:      304.25 pounds BMI:     42.59 Temp:     98.9 degrees F oral Pulse rate:   68 / minute Pulse rhythm:   regular BP sitting:   130 / 72  (left arm) Cuff size:   large  Vitals Entered By: Sydell Axon LPN (January 03, 2011 11:52 AM) CC: 30 Minute checkup   History of Present Illness: Pt here for Comp Exam. He was sent to Dr August Saucer in [place] for right knee pain and ev entually had an MRI which the pt was told was due to "a blood clot" and that his working on the knee would probably not be successful  (due npresumably to the poor vascular supply.) He is willing to be seen by Vasc. His pain has gotten better because he hasn't been doing as much walking due to a change in his job, but he has been warned he might be going back to the initial job with all the walking.  He otherwise feels well and has no complaints.  Preventive Screening-Counseling & Management  Alcohol-Tobacco     Alcohol drinks/day: <1  once a week.     Alcohol type: occas  liquor       Smoking Status: quit     Packs/Day: cigars/occ.     Year Quit: 2001     Pack years: cigars  2/day     Passive Smoke Exposure: no  Caffeine-Diet-Exercise     Caffeine use/day: one every other day.     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 Take One By Mouth Noon 2)  Pravachol 80 Mg Tabs (Pravastatin Sodium) .... Take One By Mouth At Bedtime 3)  Viagra 100 Mg  Tabs (Sildenafil Citrate) .... One Tab By Mouth Onbe Hour Prior 4)  Cardura 4 Mg  Tabs (Doxazosin Mesylate) .... One Tab By Mouth At Night.  Current Medications (verified): 1)  Norvasc 10 Mg Tabs (Amlodipine Besylate) .... Take One By Mouth Noon 2)  Pravachol 80 Mg Tabs (Pravastatin Sodium) .... Take One By Mouth At Bedtime 3)  Viagra 100 Mg  Tabs (Sildenafil Citrate) .... One Tab By Mouth Onbe Hour Prior 4)  Cardura 4 Mg  Tabs (Doxazosin Mesylate) .... One Tab By Mouth At Night. 5)  Meloxicam 15 Mg Tabs (Meloxicam) .... Take One  By Mouth Daily With Food  Allergies: No Known Drug Allergies  Past History:  Past Medical History: Last updated: 04/02/2007 Hypertension  Past Surgical History: Last updated: 04/04/2009 Tonsillectomy Perirectal Abscess Repair x 4  Fx L Thumb ORIF LE Venous U/S R nml   L Resid Thrombus in SFV and Poplitael Veins  03/29/09  Family History: Last updated: 01/03/2011 Father dec 80  Unknown Mother dec 23s Metastatic Ca ?Ovarian?   Brother A 52  Sleep Apnea Sister A 29 Sister A 62 DM Sister A 44  Social History: Last updated: 01/03/2011 Occupation:[employer]      shutdown 10/1998 [employer] 04/2009 [employer] Orders Single Domestic Partner Former Smoker Cigars 1-2/day quit 2005 Alcohol use-no Drug use-no  Risk Factors: Alcohol Use: <1  once a week. (01/03/2011) Caffeine Use: one every other day. (01/03/2011) Exercise: no (01/03/2011)  Risk Factors: Smoking Status: quit (01/03/2011) Packs/Day: cigars/occ. (01/03/2011) Passive Smoke Exposure: no (01/03/2011)  Family  History: Father dec 80  Unknown Mother dec 85s Metastatic Ca ?Ovarian?   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 Eyes:  Denies blurring, discharge, and eye pain. ENT:  Denies decreased hearing, ear discharge, earache, and ringing in ears. CV:  Complains of swelling of feet; denies chest pain or discomfort, fainting, fatigue, palpitations, shortness of breath with exertion, and swelling of hands. Resp:  Denies cough, shortness of breath, and wheezing. GI:  Complains of indigestion; denies abdominal pain, bloody stools, change in bowel habits, constipation, dark tarry stools, diarrhea, loss of appetite, nausea, vomiting, vomiting blood, and yellowish skin color. GU:  Complains of nocturia; denies discharge, dysuria, and urinary frequency. MS:  Complains of joint pain; denies low back pain, muscle aches, cramps, and stiffness; r knee. Derm:  Denies dryness, itching, and rash. Neuro:  Denies memory loss, numbness, poor balance, tingling, and tremors.  Physical Exam  General:  Well-developed,well-nourished,in no acute distress; alert,appropriate and cooperative throughout examination, mildly obese. Head:  Normocephalic and atraumatic without obvious abnormalities. No apparent alopecia but some male pattern  balding. Eyes:  Conjunctiva clear bilaterally.  Ears:  External ear exam shows no significant lesions or deformities.  Otoscopic examination reveals clear canals, tympanic membranes are intact bilaterally without bulging, retraction, inflammation or discharge. Hearing is grossly normal bilaterally. Nose:  External nasal examination shows no deformity or inflammation. Nasal  mucosa are pink and moist without lesions or exudates. Mouth:  Oral mucosa and oropharynx without lesions or exudates.  Teeth in good repair. Neck:  No deformities, masses, or tenderness noted. Chest Wall:  No deformities, masses, tenderness or gynecomastia noted. Breasts:  No masses or gynecomastia noted Lungs:  Normal respiratory effort, chest expands symmetrically. Lungs are clear to auscultation, no crackles or wheezes. Good breath sounds and air exchange to the periphery bilat. Heart:  Normal rate and regular rhythm. S1 and S2 normal without gallop, murmur, click, rub or other extra sounds. Abdomen:  Bowel sounds positive,abdomen soft and non-tender without masses, organomegaly or hernias noted. Mildly protuberant. Rectal:  No external abnormalities noted. Normal sphincter tone. No rectal masses or tenderness. G neg. Genitalia:  Testes bilaterally descended without nodularity, tenderness or masses. No scrotal masses or lesions. No penis lesions or urethral discharge. Prostate:  Prostate gland firm and smooth, no enlargement, nodularity, tenderness, mass, asymmetry or induration. 30gms. Msk:  R knee tender over posterior portin of right medial collat  ligament origin to insertion. Minimal swelling, no echymosis or warmth or redness. Pulses:  Inguinal pulse feel nml bilat, pop diff to assess bilat. Extremities:  Min chronic  swelling of ankles bilat without erythema or Homan's Sign. Skin:  Intact without suspicious lesions or rashes, acne of trunk with benign moles. Cervical Nodes:  No lymphadenopathy noted Inguinal Nodes:  No significant adenopathy Psych:  Cognition and judgment appear intact. Alert and cooperative with normal attention span and concentration. No apparent delusions, illusions, hallucinations   Impression & Recommendations:  Problem # 1:  HEALTH MAINTENANCE EXAM (ICD-V70.0)  Reviewed preventive care protocols, scheduled due services, and updated immunizations. Tdap  today.  Problem # 2:  KNEE SPRAIN, R MEDIAL COLLATERAL LIGAMENT (ICD-844.1) Assessment: Improved Overall sxs are better. 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 He has no pain at rest and no tingling. The [pain is when he is active. He was told by Dr August Saucer that MRI showed compromised blood flow to the knee. Will refer to Vasc for eval. Could be total vasc issue but if indeed a traumatic issue, compromised supply would compromise healing. Pain intially started while walking at work...classic overuse story but no overt trauma. Orders: Vascular Clinic (Vascular)  Problem # 3:  EDEMA (ICD-782.3) Assessment: Unchanged Appears status quo. Do not think worth diuretics and pt not enthused about conservative measures. Discussed elevation of the legs, use of compression stockings, sodium restiction, and medication use.   Problem # 4:  HYPERGLYCEMIA (ICD-790.29) Assessment: Unchanged  Again discussed elevated sugar and the importance of avoiding sweets and carbs to keep sugar from accelerating to diabetic levels. Kidney function stable thusfar.  Labs Reviewed: Creat: 0.9 (12/27/2010)     Problem # 5:  OBESITY NOS (ICD-278.00) Assessment: Unchanged  Discussed my belief that low carb diet to protect against advancing sugar will help him lose weight.  Ht: 71 (07/27/2009)   Wt: 304.25 (01/03/2011)   BMI: 42.59 (01/03/2011)  Problem # 6:  HYPERCHOLESTEROLEMIA (ICD-272.0) Assessment: Improved Good nos on Pravachol. Cont. His updated medication list for this problem includes:    Pravachol 80 Mg Tabs (Pravastatin sodium) .Marland Kitchen... Take one by mouth at bedtime  Labs Reviewed: SGOT: 16 (12/27/2010)   SGPT: 16 (12/27/2010)   HDL:83.60 (12/27/2010), 65.40 (07/20/2009)  LDL:90 (12/27/2010), 100 (16/08/9603)  Chol:197 (12/27/2010), 185 (07/20/2009)  Trig:117.0 (12/27/2010), 97.0  (07/20/2009)  Problem # 7:  HYPERTENSION (ICD-401.9) Assessment: Unchanged Adequate control. Cont curr meds. His updated medication list for this problem includes:    Norvasc 10 Mg Tabs (Amlodipine besylate) .Marland Kitchen... Take one by mouth noon    Cardura 4 Mg Tabs (Doxazosin mesylate) ..... One tab by mouth at night.  BP today: 130/72 Prior BP: 132/74 (02/01/2010)  Labs Reviewed: K+: 4.3 (12/27/2010) Creat: : 0.9 (12/27/2010)   Chol: 197 (12/27/2010)   HDL: 83.60 (12/27/2010)   LDL: 90 (12/27/2010)   TG: 117.0 (12/27/2010)  Problem # 8:  BENIGN PROSTATIC HYPERTROPHY, HX OF (ICD-V13.8) Assessment: Unchanged Stable on Cardura.  Complete Medication List: 1)  Norvasc 10 Mg Tabs (Amlodipine besylate) .... Take one by mouth noon 2)  Pravachol 80 Mg Tabs (Pravastatin sodium) .... Take one by mouth at bedtime 3)  Viagra 100 Mg Tabs (Sildenafil citrate) .... One tab by mouth onbe hour prior 4)  Cardura 4 Mg Tabs (Doxazosin mesylate) .... One tab by mouth at night. 5)  Meloxicam 15 Mg Tabs (  Meloxicam) .... Take one  by mouth daily with food  Patient Instructions: 1)  Refer to Vasc Surgery for eval. 2)  Give Tdap today.   Orders Added: 1)  Vascular Clinic [Vascular] 2)  Est. Patient 40-64 years [phone]    Current Allergies (reviewed today): No known allergies   Appended Document: CPX/ CLE   Tetanus/Td Vaccine    Vaccine Type: Tdap    Site: left deltoid    Mfr: [employer]oSmithK[employer]    Dose: 0.5 ml    Route: IM    Given by: Sydell Axon LPN    Exp. Date: 09/14/2012    Lot #: [id number]    VIS given: 10/13/08 version given January 03, 2011.

## 2011-01-18 DIAGNOSIS — M79609 Pain in unspecified limb: Secondary | ICD-10-CM | POA: Insufficient documentation

## 2011-01-23 NOTE — Assessment & Plan Note (Signed)
Summary: New Pt Eval      Allergies Added: NKDA  Visit Type:  Initial Consult Primary Provider:  Shaune Leeks MD  CC:  Bilateral knee pain.  History of Present Illness: 55 year-old male referred for initial evaluation of lower extremity PAD. He has developed right knee pain over the past year. He was diagnosed with a tear of the medial meniscus and a subchondral insufficiency fracture by MRI. He was referred here because of a question of impaired vascular supply to his leg.  He denies calf pain with ambulation. No history of rest pain or ulceration. He has no history of cardiac disease and denies stroke or TIA. He was diagnosed with bilateral DVT and pulmonary emboli in 2009 and was treated with warfarin for 12 months. He has had no recurrent DVT.  He notes the right knee pain has nearly resolved now that he has changed jobs and is not doing much walking.  Current Medications (verified): 1)  Norvasc 10 Mg Tabs (Amlodipine Besylate) .... Take One By Mouth Noon 2)  Pravachol 80 Mg Tabs (Pravastatin Sodium) .... Take One By Mouth At Bedtime 3)  Viagra 100 Mg  Tabs (Sildenafil Citrate) .... One Tab By Mouth Onbe Hour Prior 4)  Cardura 4 Mg  Tabs (Doxazosin Mesylate) .... One Tab By Mouth At Night. 5)  Meloxicam 15 Mg Tabs (Meloxicam) .... Take One  By Mouth Daily With Food  Allergies (verified): No Known Drug Allergies  Past History:  Past Medical History: Last updated: 01/04/2011  1. Bilateral pulmonary embolisms with pulmonary infarcts.   2. Hypertension.   3. Hyperlipidemia.     Family History: Reviewed history from 01/04/2011 and no changes required. Father dec 80  Unknown Mother dec 67s Metastatic Ca ?Ovarian?   Brother A 52  Sleep Apnea Sister A 23 Sister A 18 DM Sister A 18  Social History: Reviewed history from 01/04/2011 and no changes required. Single Former Smoker Cigars 1-2/day quit 2005 Alcohol use-no Drug use-no  Review of Systems   Negative except as per HPI   Vital Signs:  Patient profile:   55 year old male Height:      71 inches Weight:      308.50 pounds BMI:     43.18 Pulse rate:   56 / minute Pulse rhythm:   regular Resp:     18 per minute BP sitting:   134 / 70  (left arm) Cuff size:   large  Vitals Entered By: Vikki Ports (January 05, 2011 3:14 PM)  Serial Vital Signs/Assessments:  Time      Position  BP       Pulse  Resp  Temp     By           R Arm     128/74                         Vikki Ports   Physical Exam  General:  Pt is alert and oriented, obese male in no acute distress. HEENT: normal Neck: normal carotid upstrokes without bruits, JVP normal Lungs: CTA CV: RRR without murmur or gallop Abd: soft, NT, positive BS, no bruit, no organomegaly Ext: no clubbing, cyanosis, or edema. femoral pulses 2+ without bruits, pedals 2+= Skin: warm and dry without rash    MRI EXAM  Procedure date:  03/14/2010  Findings:      R knee MRI: Subchondral fracture of the medical femoral condyle which may  represent a post-traumatic abnormality or an insufficiency fracture (SONK).  There is a complete radial tear of the midbody of the medial meniscus.  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 This is not related to large vessel arterial disease and the patient has no symptoms of claudication or ulcers. He has a normal peripheral pulse exam. He should continue with risk factor modification to mitigate overall cardiovascular risk, but I don't see any current problems related to arterial disease. Would be happy to see him back in the future if problems arise.  Other Orders: EKG w/ Interpretation (93000)  Patient Instructions: 1)  Your physician recommends that you schedule a follow-up appointment as needed.  2)  Your physician recommends that you continue on your current  medications as directed. Please refer to the Current Medication list given to you today.  Appended Document: New Pt Eval EKG shows sinus brady 56 bpm, first degree AV block, otherwise within normal limits.

## 2011-04-10 NOTE — H&P (Signed)
NAME:  Philip Middleton, Philip Middleton              ACCOUNT NO.:  [id number]   MEDICAL RECORD NO.:  [id number]          PATIENT TYPE:  INP   LOCATION:  4715                         FACILITY:  [facility]   PHYSICIAN:  Michiel Cowboy, MDDATE OF BIRTH:  1956/11/17   DATE OF ADMISSION:  10/13/2008  DATE OF DISCHARGE:                              HISTORY & PHYSICAL   PRIMARY CARE Tlaloc Taddei:  Arta Silence, M.D. with [facility].   CHIEF COMPLAINT:  Shortness of breath, chills and fever.   HISTORY OF THE PRESENT ILLNESS:  The patient is a 55 year old gentleman  with a history of hypertension and hyperlipidemia who presented 8 days  ago to the ED, and was diagnosed with possible pneumonia, and sent home  on Levaquin for 7 days.  His chest x-ray initially showed some possible  nodularity and interstitial findings, which could not rule out  pneumonia.  Thereafter he took a trip to the beach and since Friday  started developing left lower extremity swelling and pain.  He presented  to his primary care Sydna Brodowski who obtained Dopplers of which the  preliminary study showed thrombosis seen in the right gastrocnemius vein  and the right lesser saphenous vein as well as all deep veins being free  of thrombus, but superficial femoral vein was thrombosed from the origin  to the popliteal and to the posterior veins.  On the left there was a  partially occluded thrombus seen in superficial femoral vein from the  bifurcation to the popliteal vein with the popliteal vein being  completely occluded.   The patient was sent to the emergency department where a he has noted to  be febrile.  Chest x-ray was performed, which showed improvement of  diffuse interstitial findings, but there was an area of the right lung  base that was slightly increased at which point [facility]  admitted him for further evaluation.   REVIEW OF SYSTEMS:  The review of systems is significant for chills,  fevers and a history of a long  trip to the beach.  Overall the patient  has a sedentary lifestyle.  He has had increased shortness of breath and  dyspnea on exertion above his usual baseline since the swelling of his  left leg.  Otherwise the review of systems is unremarkable.   PAST MEDICAL HISTORY:  The patient has a past history of hypertension  and hyperlipidemia.  The patient does not know which medicines he takes.   ALLERGIES:  No known drug allergies.   MEDICATIONS:  Norvasc, pravastatin and Cardura, although he is not sure  of the doses.   SOCIAL HISTORY:  The patient has never smoked or drank; does not use  alcohol.   FAMILY HISTORY:  The family history is noncontributory.   PHYSICAL EXAMINATION:  VITAL SIGNS:  Temperature 101.2, blood pressure  132/68, pulse 100, respirations 18, and satting 94% on room air.  GENERAL APPEARANCE:  The patient appears to be in no acute distress and  is sitting down in bed.  HEENT:  Head is atraumatic.  Moist mucous membranes.  LUNGS:  The lungs reveal there are  crackles bilaterally, worse on the  right than on the left.  HEART:  The heart has a regular rate and rhythm.  No murmurs, rubs or  gallops.  ABDOMEN:  The abdomen is soft, nontender and nondistended.  EXTREMITIES:  Lower Extremities: There is a swelling on the left with  tenderness at the calf; otherwise, unremarkable.  NEUROLOGIC EXAMINATION:  Neurologically patient is intact.  Cranial  nerves II-XII are intact.   LABORATORY DATA:  White blood cell count 17.5 and hemoglobin 13.6.  Sodium of 138, potassium 4.0 and creatinine 1.2.  UA showing positive  for nitrites and many bacteria.  Chest x-ray showed increased opacity in  the right lung base.  EKG was not obtained.   ASSESSMENT:  This is 55 year old gentleman with above-the-knee  superficial thrombosis and possibly pneumonia, and a urinary tract  infection.  There is also shortness of breath present.   1. Shortness of breath.  I winder if this is  secondary to pneumonia.      We will obtain a computerized tomography of the chest to rule out      pulmonary embolus.  There is evidence of superficial venous      thrombosis could be associated with pulmonary embolus if it is      above-the-knee.  Also given the history of worsening shortness of      breath after the swelling makes pulmonary embolus somewhat more      worrisome.  Also the initial findings of interstitial lung disease      would be better visualized by a computerized axial tomographic      scan.  We will, for right now, do Lovenox per review of the      literature; use Lovenox for superficial thrombosis was suggested.   1. Superficial venous thrombosis of the femoral vein above the knee on      the left.  We will choose for now to treat with Lovenox twice a      day.  We may need further discussion with the      hematologist/oncologist to see if we should do otherwise.   1. Possible urinary tract infection.  Although there was no white      blood cell count elevation in the urine, but there was some      bacteria and nitrite positive in the urine for right now we will      choose Levaquin as this will cover lungs as well as urine.  We are      unsure if the patient was undertreated by his prior Levaquin      course.   1. Possible pneumonia.  Computerized tomographic scan will help [place] to      evaluate this further.  For right now we will cover with Levaquin;      and, obtain blood culture, sputum culture and give albuterol as      needed and incentive spirometer.   1. History of hypertension, right now normotensive.  I am unsure of      the dosage of his medications.  We will need to restart home once      he becomes hypertensive.   1. History of hyperlipidemia.  Continue pravastatin at the presumed      dose.  We will need to confirm doses an the morning.   1. Prophylaxes.  Protonix plus Lovenox.   Dr. Felicity Coyer will assume care in the A.M.      Michiel Cowboy, MD  Electronically Signed     AVD/MEDQ  D:  10/14/2008  T:  10/14/2008  Job:  [id number]

## 2011-04-10 NOTE — Discharge Summary (Signed)
NAMEMarland Middleton  NORWOOD, QUEZADA NO.:  [id number]   MEDICAL RECORD NO.:  [id number]          PATIENT TYPE:  INP   LOCATION:  4715                         FACILITY:  [facility]   PHYSICIAN:  Barbette Hair. Artist Pais, DO      DATE OF BIRTH:  1956-10-08   DATE OF ADMISSION:  10/13/2008  DATE OF DISCHARGE:  10/21/2008                               DISCHARGE SUMMARY   DISCHARGE DIAGNOSES:  1. Bilateral pulmonary embolisms with pulmonary infarcts.  2. Hypertension.  3. Hyperlipidemia.   DISCHARGE MEDICATIONS:  1. Proscar 5 mg once daily.  2. Simvastatin 10 mg once daily.  3. Cardura 4 mg at bedtime.  4. Coumadin 10 mg once daily until seen by Dr. Hetty Ely.  5. Lovenox 35 mg q.12 h.  The patient received AM dose in hospital.      He was instructed to take evening dose tonight and q.12 hours      tomorrow.   FOLLOWUP INSTRUCTIONS:  He has an appointment at 10 a.m. with Dr.  Hetty Ely on Monday at October 25, 2008.  He will need a repeat INR.  I  also suggested arranging CT of the abdomen and pelvis with IV contrast  as outpatient to rule out intra-abdominal source for the DVT/malignancy.  The patient was advised to call Dr. Lorenza Chick office if he developed  chest pain or shortness of breath.   HOSPITAL COURSE:  The patient is a 55 year old African American  gentleman with history of hypertension and hyperlipidemia who presented  with shortness of breath.  He was initially seen in the emergency room 8  days ago and diagnosed with possible pneumonia.  He was treated with  Levaquin x7 days.  He recently returned from a trip to the beach and  noticed a left lower extremity swelling and pain.  He presented to the  primary care Husam Hohn who obtained Doppler of lower extremities, which  showed thrombosis in the right gastrocnemius vein and right lesser  saphenous vein.  On the left, there was note of partially occluded  thrombus seen in the superficial femoral vein and bifurcation to the  popliteal vein.  He was sent to the emergency room and a CT of the chest  with PE protocol was obtained.  CT scan of the chest was positive for  acute pulmonary emboli.  CT scan noted moderate to large clot burden in  the left upper and right lower lobe.  Moderate peripheral clot burden in  the left lower lobe.  No central or saddle embolus seen.  It also noted  a small right pleural effusion.  Wedge-shaped airspace opacities in both  lower lobes suspicious for pulmonary infarction.  There was mild  mediastinal lymphadenopathy which was noted to be reactive.   The patient was started on Lovenox and anticoagulated with Coumadin.  At  the time of discharge, the patient's INR was 2.1.  Pharmacy recommended  bridging with Lovenox until October 22, 2008.  There was no evidence of  bleeding.   The patient denies family history of blood clots.  Hypercoagulable panel  was pending at the time of  this dictation.   CONDITION ON DISCHARGE:  The patient was chest pain-free and without  shortness of breath.  He was felt medically stable for discharge.      Barbette Hair. Artist Pais, DO  Electronically Signed     RDY/MEDQ  D:  10/21/2008  T:  10/21/2008  Job:  [id number]   cc:   Arta Silence, MD

## 2011-04-17 ENCOUNTER — Encounter: Payer: Self-pay | Admitting: Family Medicine

## 2011-04-18 ENCOUNTER — Ambulatory Visit (INDEPENDENT_AMBULATORY_CARE_PROVIDER_SITE_OTHER): Payer: BC Managed Care – PPO | Admitting: Family Medicine

## 2011-04-18 ENCOUNTER — Encounter: Payer: Self-pay | Admitting: Family Medicine

## 2011-04-18 DIAGNOSIS — M25561 Pain in right knee: Secondary | ICD-10-CM

## 2011-04-18 DIAGNOSIS — M25569 Pain in unspecified knee: Secondary | ICD-10-CM

## 2011-04-18 NOTE — Progress Notes (Signed)
  Subjective:    Patient ID: Philip Middleton, male    DOB: February 16, 1956, 55 y.o.   MRN: [id number]  HPI Pt here for Acute appt to recheck his right  knee which hurts in the proximal medial collat lig insertion site. Chart review shows the pt has had fracture of the distal femoral epicondyle in that area approx one year ago. 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 He was told it was a fracture of insufficiency and was referred to Dr Copper to assess his vascular health. It was felt to be nml. He still has discomfort in the area.     Review of Systems H/o DVT bilat, resolved, PAD health good. 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     Objective:   Physical ExamWDWN BM NAD  Right knee with foacl discomfort to deepmpalpation over the area of the proximal origin of the medial collat lig in the area of the medial femoral epicondyle. No swelling or erythema noted. Gait nml.         Assessment & Plan:

## 2011-04-18 NOTE — Assessment & Plan Note (Signed)
Clinically c/w slowly healing prior fracture that has not been given formal time to heal. The pain is improving and does not appear to be in the jointline area. Do not think this a joint problem but a bony epicondylar problem. Could also be a prox medial collat lig problem but exam not totally c/w.

## 2011-07-11 ENCOUNTER — Ambulatory Visit (INDEPENDENT_AMBULATORY_CARE_PROVIDER_SITE_OTHER): Payer: BC Managed Care – PPO | Admitting: Family Medicine

## 2011-07-11 ENCOUNTER — Encounter: Payer: Self-pay | Admitting: Family Medicine

## 2011-07-11 DIAGNOSIS — N410 Acute prostatitis: Secondary | ICD-10-CM

## 2011-07-11 DIAGNOSIS — N529 Male erectile dysfunction, unspecified: Secondary | ICD-10-CM

## 2011-07-11 MED ORDER — CIPROFLOXACIN HCL 500 MG PO TABS: 500.0000 mg | ORAL_TABLET | Freq: Two times a day (BID) | ORAL | Status: AC

## 2011-07-11 MED ORDER — SILDENAFIL CITRATE 100 MG PO TABS: 100.0000 mg | ORAL_TABLET | ORAL | Status: DC | PRN

## 2011-07-11 NOTE — Assessment & Plan Note (Addendum)
Exam benign but due to past history of chronic prostatitis and pt's sxs, will treat as acute prostatitis. Start Cipro and take bid for ten days. If sxs improve, get refill and take for total of 20 days. If no impr, assume is mechanical LBP and use heat and ice and minimal NSAIDs and give time. RTC id sxs don't improve.

## 2011-07-11 NOTE — Assessment & Plan Note (Signed)
Requested script for Viagra which has worked well in the past. Script sent in.

## 2011-07-11 NOTE — Progress Notes (Signed)
  Subjective:    Patient ID: Philip Middleton, male    DOB: 11-Aug-1956, 55 y.o.   MRN: [id number]  HPI Pt here as acute appt for back pain that he thinks could his prostate which he has had before. He also is having erection problems and has used Viagra  In the past and would like a new prescription.  He denies fever or chills. No burning or pain with urination. No increase in driving lately, drives farther to work lately in his car.  He has done no unusual lifting, no trauma, no back pain with sitting in his car.  Review of SystemsNoncontributory except as above.       Objective:   Physical Exam  Constitutional: He appears well-developed and well-nourished. No distress.       Obese.  HENT:  Head: Normocephalic and atraumatic.  Right Ear: External ear normal.  Left Ear: External ear normal.  Nose: Nose normal.  Mouth/Throat: Oropharynx is clear and moist.  Eyes: Conjunctivae and EOM are normal. Pupils are equal, round, and reactive to light. Right eye exhibits no discharge. Left eye exhibits no discharge.  Neck: Normal range of motion. Neck supple.  Cardiovascular: Normal rate and regular rhythm.   Pulmonary/Chest: Effort normal and breath sounds normal. He has no wheezes.  Abdominal: Soft. Bowel sounds are normal. He exhibits no distension. There is no tenderness. There is no rebound and no guarding.  Genitourinary: Rectum normal. Guaiac negative stool.       Prostate 40gms, difficult exam due to size of pt, NT, smooth and firm without nodularity, raphe intact.  Lymphadenopathy:    He has no cervical adenopathy.  Skin: He is not diaphoretic.          Assessment & Plan:

## 2011-08-28 LAB — CBC
HCT: 30.8 — ABNORMAL LOW
HCT: 32.8 — ABNORMAL LOW
HCT: 35.3 — ABNORMAL LOW
HCT: 39
Hemoglobin: 10.4 — ABNORMAL LOW
Hemoglobin: 10.6 — ABNORMAL LOW
Hemoglobin: 12.8 — ABNORMAL LOW
MCHC: 32.4
MCHC: 33.6
MCHC: 33.7
MCHC: 33.7
MCHC: 33.8
MCHC: 34
MCHC: 34.2
MCHC: 34.4
MCV: 93
MCV: 93.3
MCV: 93.9
MCV: 94.5
MCV: 95.3
Platelets: 294
Platelets: 315
Platelets: 431 — ABNORMAL HIGH
RBC: 3.31 — ABNORMAL LOW
RBC: 3.32 — ABNORMAL LOW
RBC: 3.33 — ABNORMAL LOW
RBC: 3.35 — ABNORMAL LOW
RBC: 3.76 — ABNORMAL LOW
RBC: 4.19 — ABNORMAL LOW
RDW: 14.2
RDW: 14.6
RDW: 14.6
RDW: 14.6
RDW: 14.8
WBC: 15.5 — ABNORMAL HIGH
WBC: 16.9 — ABNORMAL HIGH
WBC: 4.7
WBC: 9.3
WBC: 9.6
WBC: 9.9

## 2011-08-28 LAB — PROTIME-INR
INR: 0.9
INR: 1.2
INR: 1.2
INR: 1.4
INR: 1.5
INR: 1.7 — ABNORMAL HIGH
Prothrombin Time: 12.6
Prothrombin Time: 15.4 — ABNORMAL HIGH
Prothrombin Time: 15.5 — ABNORMAL HIGH
Prothrombin Time: 17.8 — ABNORMAL HIGH
Prothrombin Time: 19 — ABNORMAL HIGH
Prothrombin Time: 21.3 — ABNORMAL HIGH

## 2011-08-28 LAB — DIFFERENTIAL
Basophils Absolute: 0
Basophils Absolute: 0
Basophils Absolute: 0
Basophils Absolute: 0
Basophils Relative: 0
Basophils Relative: 0
Eosinophils Absolute: 0
Eosinophils Absolute: 0
Eosinophils Relative: 0
Eosinophils Relative: 0
Lymphocytes Relative: 21
Lymphocytes Relative: 7 — ABNORMAL LOW
Lymphs Abs: 0.9
Lymphs Abs: 1.1
Monocytes Absolute: 1
Monocytes Absolute: 1.6 — ABNORMAL HIGH
Monocytes Relative: 2 — ABNORMAL LOW
Neutro Abs: 14.3 — ABNORMAL HIGH
Neutro Abs: 14.4 — ABNORMAL HIGH
Neutro Abs: 6
Neutro Abs: 8.5 — ABNORMAL HIGH
Neutrophils Relative %: 75
Neutrophils Relative %: 85 — ABNORMAL HIGH

## 2011-08-28 LAB — URINALYSIS, ROUTINE W REFLEX MICROSCOPIC
Ketones, ur: 15 — AB
Nitrite: POSITIVE — AB
Protein, ur: 100 — AB
Protein, ur: 100 — AB
Urobilinogen, UA: 0.2

## 2011-08-28 LAB — COMPREHENSIVE METABOLIC PANEL
ALT: 29
Alkaline Phosphatase: 44
BUN: 15
BUN: 19
CO2: 25
CO2: 27
Calcium: 8.6
Calcium: 8.6
Chloride: 100
Chloride: 99
Creatinine, Ser: 1
Creatinine, Ser: 1.53 — ABNORMAL HIGH
GFR calc Af Amer: 58 — ABNORMAL LOW
GFR calc Af Amer: >60
GFR calc non Af Amer: >60
Glucose, Bld: 130 — ABNORMAL HIGH
Glucose, Bld: 146 — ABNORMAL HIGH
Total Bilirubin: 1.8 — ABNORMAL HIGH

## 2011-08-28 LAB — POCT I-STAT, CHEM 8
Calcium, Ion: 1.16
Chloride: 105
Glucose, Bld: 116 — ABNORMAL HIGH
HCT: 40
Hemoglobin: 13.6
TCO2: 29

## 2011-08-28 LAB — URINE CULTURE
Colony Count: 10000
Colony Count: NO GROWTH

## 2011-08-28 LAB — CULTURE, BLOOD (ROUTINE X 2): Culture: NO GROWTH

## 2011-08-28 LAB — LUPUS ANTICOAGULANT PANEL
Lupus Anticoagulant: NOT DETECTED
PTTLA 4:1 Mix: 62.3 — ABNORMAL HIGH (ref 36.3–48.8)
PTTLA Confirmation: 2.2 (ref <8.0)
dRVVT Incubated 1:1 Mix: 43.5 (ref 36.1–47.0)

## 2011-08-28 LAB — CARDIAC PANEL(CRET KIN+CKTOT+MB+TROPI)
Relative Index: INVALID
Relative Index: INVALID
Troponin I: 0.03

## 2011-08-28 LAB — FACTOR 5 LEIDEN

## 2011-08-28 LAB — CARDIOLIPIN ANTIBODIES, IGG, IGM, IGA: Anticardiolipin IgG: <7 — ABNORMAL LOW (ref <11)

## 2011-08-28 LAB — APTT
aPTT: 35
aPTT: 37
aPTT: 50 — ABNORMAL HIGH

## 2011-08-28 LAB — HEPATIC FUNCTION PANEL
Alkaline Phosphatase: 131 — ABNORMAL HIGH
Indirect Bilirubin: 0.6
Total Bilirubin: 1
Total Protein: 5.6 — ABNORMAL LOW

## 2011-08-28 LAB — PROTEIN C ACTIVITY: Protein C Activity: 151 % — ABNORMAL HIGH (ref 75–133)

## 2011-08-28 LAB — URINE MICROSCOPIC-ADD ON

## 2011-08-28 LAB — PROTEIN S ACTIVITY: Protein S Activity: 90 % (ref 69–129)

## 2011-08-28 LAB — PROTEIN S, TOTAL: Protein S Ag, Total: 150 % — ABNORMAL HIGH (ref 70–140)

## 2011-08-28 LAB — LIPASE, BLOOD: Lipase: 73 — ABNORMAL HIGH

## 2011-08-28 LAB — BETA-2-GLYCOPROTEIN I ABS, IGG/M/A
Beta-2 Glyco I IgG: <4 U/mL (ref <20)
Beta-2-Glycoprotein I IgA: 11 U/mL (ref <10)

## 2011-08-28 LAB — ANTITHROMBIN III: AntiThromb III Func: 252 — ABNORMAL HIGH (ref 76–126)

## 2011-08-28 LAB — HOMOCYSTEINE: Homocysteine: 8.2

## 2012-01-16 ENCOUNTER — Other Ambulatory Visit: Payer: Self-pay | Admitting: Family Medicine

## 2012-02-18 ENCOUNTER — Other Ambulatory Visit: Payer: Self-pay | Admitting: Family Medicine

## 2012-02-27 ENCOUNTER — Ambulatory Visit (INDEPENDENT_AMBULATORY_CARE_PROVIDER_SITE_OTHER): Payer: BC Managed Care – PPO | Admitting: Family Medicine

## 2012-02-27 ENCOUNTER — Encounter: Payer: Self-pay | Admitting: Family Medicine

## 2012-02-27 VITALS — BP 132/70 | HR 80 | Temp 99.0°F | Wt 313.2 lb

## 2012-02-27 DIAGNOSIS — I2699 Other pulmonary embolism without acute cor pulmonale: Secondary | ICD-10-CM

## 2012-02-27 DIAGNOSIS — E7800 Pure hypercholesterolemia, unspecified: Secondary | ICD-10-CM

## 2012-02-27 DIAGNOSIS — I10 Essential (primary) hypertension: Secondary | ICD-10-CM

## 2012-02-27 MED ORDER — AMLODIPINE BESYLATE 10 MG PO TABS: 10.0000 mg | ORAL_TABLET | Freq: Every day | ORAL | Status: DC

## 2012-02-27 MED ORDER — PRAVASTATIN SODIUM 80 MG PO TABS: 80.0000 mg | ORAL_TABLET | Freq: Every day | ORAL | Status: DC

## 2012-02-27 MED ORDER — ASPIRIN 81 MG PO TABS: 81.0000 mg | ORAL_TABLET | Freq: Every day | ORAL | Status: AC

## 2012-02-27 NOTE — Assessment & Plan Note (Signed)
Chronic. Continue meds.

## 2012-02-27 NOTE — Progress Notes (Signed)
  Subjective:    Patient ID: Philip Middleton, male    DOB: 29-May-1956, 56 y.o.   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  HTN - No HA, vision changes, CP/tightness.  Mild SOB with brisk walking.  + leg swelling worse at end of day. Compliant with amlodipine.    No coughing.  No fevers/chills.  HLD - compliant with pravastatin 80mg  nightly.  Not on baby aspirin. Past Medical History  Diagnosis Date  . Bilateral pulmonary embolism 2009    with pulmonary infarcts.  . Hypertension   . Hyperlipemia   . Peri-rectal abscess     repair x 4  . left thumb ORIF     fracture    Review of Systems Per HPI    Objective:   Physical Exam  Nursing note and vitals reviewed. Constitutional: He appears well-developed and well-nourished. No distress.       obese  HENT:  Head: Normocephalic and atraumatic.  Mouth/Throat: Oropharynx is clear and moist. No oropharyngeal exudate.  Eyes: Conjunctivae and EOM are normal. Pupils are equal, round, and reactive to light. No scleral icterus.  Neck: Normal range of motion. Neck supple. Carotid bruit is not present.  Cardiovascular: Normal rate, regular rhythm, normal heart sounds and intact distal pulses.   No murmur heard. Pulmonary/Chest: Breath sounds normal. No respiratory distress. He has no wheezes. He has no rales.  Musculoskeletal: He exhibits edema (nonpitting edema).  Skin: Skin is warm and dry. No rash noted.  Psychiatric: He has a normal mood and affect.       Assessment & Plan:

## 2012-02-27 NOTE — Assessment & Plan Note (Signed)
Chronic, well controlled last check 2012.  Recheck when returns fasting for physical.

## 2012-02-27 NOTE — Patient Instructions (Signed)
Start baby aspirin daily (81mg ) Return at your convenience fasting for blood work ,afterwards for physical. Good to see you today, call us with quesitons.

## 2012-02-27 NOTE — Assessment & Plan Note (Signed)
Completed coumadin for this. rec start baby aspirin.

## 2012-06-14 ENCOUNTER — Other Ambulatory Visit: Payer: Self-pay | Admitting: Family Medicine

## 2012-06-14 DIAGNOSIS — R7309 Other abnormal glucose: Secondary | ICD-10-CM

## 2012-06-14 DIAGNOSIS — E7800 Pure hypercholesterolemia, unspecified: Secondary | ICD-10-CM

## 2012-06-14 DIAGNOSIS — I10 Essential (primary) hypertension: Secondary | ICD-10-CM

## 2012-06-14 DIAGNOSIS — Z125 Encounter for screening for malignant neoplasm of prostate: Secondary | ICD-10-CM

## 2012-06-18 ENCOUNTER — Other Ambulatory Visit (INDEPENDENT_AMBULATORY_CARE_PROVIDER_SITE_OTHER): Payer: BC Managed Care – PPO

## 2012-06-18 DIAGNOSIS — I10 Essential (primary) hypertension: Secondary | ICD-10-CM

## 2012-06-18 DIAGNOSIS — Z125 Encounter for screening for malignant neoplasm of prostate: Secondary | ICD-10-CM

## 2012-06-18 DIAGNOSIS — R7309 Other abnormal glucose: Secondary | ICD-10-CM

## 2012-06-18 DIAGNOSIS — E7800 Pure hypercholesterolemia, unspecified: Secondary | ICD-10-CM

## 2012-06-18 LAB — COMPREHENSIVE METABOLIC PANEL
ALT: 24 U/L (ref 0–53)
AST: 25 U/L (ref 0–37)
Alkaline Phosphatase: 65 U/L (ref 39–117)
BUN: 15 mg/dL (ref 6–23)
Chloride: 101 mEq/L (ref 96–112)
Creatinine, Ser: 0.9 mg/dL (ref 0.4–1.5)

## 2012-06-18 LAB — LDL CHOLESTEROL, DIRECT: Direct LDL: 126.9 mg/dL

## 2012-06-18 LAB — LIPID PANEL
Cholesterol: 220 mg/dL — ABNORMAL HIGH (ref 0–200)
HDL: 76.3 mg/dL (ref >39.00)
Total CHOL/HDL Ratio: 3
VLDL: 25.8 mg/dL (ref 0.0–40.0)

## 2012-06-18 LAB — HEMOGLOBIN A1C: Hgb A1c MFr Bld: 6.1 % (ref 4.6–6.5)

## 2012-06-25 ENCOUNTER — Encounter: Payer: Self-pay | Admitting: Family Medicine

## 2012-06-25 ENCOUNTER — Ambulatory Visit (INDEPENDENT_AMBULATORY_CARE_PROVIDER_SITE_OTHER): Payer: BC Managed Care – PPO | Admitting: Family Medicine

## 2012-06-25 VITALS — BP 136/82 | HR 72 | Temp 98.1°F | Ht 70.0 in | Wt 319.2 lb

## 2012-06-25 DIAGNOSIS — I10 Essential (primary) hypertension: Secondary | ICD-10-CM

## 2012-06-25 DIAGNOSIS — Z87898 Personal history of other specified conditions: Secondary | ICD-10-CM

## 2012-06-25 DIAGNOSIS — E7800 Pure hypercholesterolemia, unspecified: Secondary | ICD-10-CM

## 2012-06-25 DIAGNOSIS — R7309 Other abnormal glucose: Secondary | ICD-10-CM

## 2012-06-25 DIAGNOSIS — Z0000 Encounter for general adult medical examination without abnormal findings: Secondary | ICD-10-CM | POA: Insufficient documentation

## 2012-06-25 DIAGNOSIS — H6091 Unspecified otitis externa, right ear: Secondary | ICD-10-CM | POA: Insufficient documentation

## 2012-06-25 DIAGNOSIS — Z86711 Personal history of pulmonary embolism: Secondary | ICD-10-CM

## 2012-06-25 DIAGNOSIS — Z1211 Encounter for screening for malignant neoplasm of colon: Secondary | ICD-10-CM

## 2012-06-25 DIAGNOSIS — N529 Male erectile dysfunction, unspecified: Secondary | ICD-10-CM

## 2012-06-25 DIAGNOSIS — H60399 Other infective otitis externa, unspecified ear: Secondary | ICD-10-CM

## 2012-06-25 DIAGNOSIS — R609 Edema, unspecified: Secondary | ICD-10-CM

## 2012-06-25 LAB — POC HEMOCCULT BLD/STL (OFFICE/1-CARD/DIAGNOSTIC): Fecal Occult Blood, POC: NEGATIVE

## 2012-06-25 MED ORDER — HYDROCORTISONE-ACETIC ACID 1-2 % OT SOLN: 4.0000 [drp] | Freq: Two times a day (BID) | OTIC | Status: AC

## 2012-06-25 MED ORDER — SILDENAFIL CITRATE 100 MG PO TABS: 100.0000 mg | ORAL_TABLET | ORAL | Status: DC | PRN

## 2012-06-25 NOTE — Assessment & Plan Note (Signed)
Preventative protocols reviewed and updated unless pt declined. Discussed healthy diet/lifestyle. Reviewed labs in detail.

## 2012-06-25 NOTE — Assessment & Plan Note (Signed)
viagra refilled.  

## 2012-06-25 NOTE — Progress Notes (Signed)
Subjective:    Patient ID: Philip Middleton, male    DOB: 11/14/1956, 56 y.o.   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  Residual bilateral leg swelling L>R after DVTs 2009.  Wears tight socks when working.  Doesn't wear otherwise.  Not interested in compression stockings.  presents for complete physical.  Works for [employer], works at [employer].  BP Readings from Last 3 Encounters:  06/25/12 136/82  02/27/12 132/70  07/11/11 128/70    Preventative: Tetanus 2012 Flu shot - doesn't receive. Colon screening - has had normal stool kits in past, would like to repeat this year. Prostate screening - uncle with prostate cancer.  Gets checked yearly.  Caffeine: 2-3 cups coffee/day Lives alone, no pets, 1 child Occupation: Press photographer, warehouse Activity: stays active at work, no regular exercise Diet: some water, some fruits/vegetables   Medications and allergies reviewed and updated in chart.  Past histories reviewed and updated if relevant as below. Patient Active Problem List  Diagnosis  . HYPERCHOLESTEROLEMIA  . OBESITY NOS  . HYPERTENSION  . History of pulmonary embolism  . AC VENUS EMBO & THROMB UNSPEC DEEP VES LOWER EXT  . PROSTATITIS, CHRONIC  . ERECTILE DYSFUNCTION, ORGANIC  . EDEMA  . HYPERGLYCEMIA  . BENIGN PROSTATIC HYPERTROPHY, HX OF  . Right knee pain  . Prostatitis, acute   Past Medical History  Diagnosis Date  . Bilateral pulmonary embolism 2009    with pulmonary infarcts s/p 6+ mo coumadin  . Hypertension   . Hyperlipemia   . Peri-rectal abscess     repair x 4  . left thumb ORIF     fracture   Past Surgical History  Procedure Date  . Le venous u/s nml 03/29/09    L residual thrombus in SFV and poplitael veins  . Le venous u/s nml 10/2009    no superficial or deep thrombus   History   Substance Use Topics  . Smoking status: Former Smoker    Types: Cigars  . Smokeless tobacco: Never Used   Comment: smoked 1-2 daily, quit 2005  . Alcohol Use: Yes     occassionally   Family History  Problem Relation Age of Onset  . Cancer Mother     metastatic cancer, ? ovarian  . Sleep apnea Brother   . Diabetes Sister   . Cancer Maternal Uncle 63    prostate   No Known Allergies Current Outpatient Prescriptions on File Prior to Visit  Medication Sig Dispense Refill  . amLODipine (NORVASC) 10 MG tablet Take 1 tablet (10 mg total) by mouth daily.  30 tablet  11  . aspirin 81 MG tablet Take 1 tablet (81 mg total) by mouth daily.      . Multiple Vitamin (MULTIVITAMIN) tablet Take 1 tablet by mouth daily.        . pravastatin (PRAVACHOL) 80 MG tablet Take 1 tablet (80 mg total) by mouth at bedtime.  30 tablet  11  . DISCONTD: sildenafil (VIAGRA) 100 MG tablet Take 1 tablet (100 mg total) by mouth as needed for erectile dysfunction. Take one by mouth one hour prior  10 tablet  2     Review of Systems  Constitutional: Negative for fever, chills, activity change, appetite change, fatigue and unexpected weight change.  HENT: Negative for hearing  loss and neck pain.   Eyes: Negative for visual disturbance.  Respiratory: Positive for shortness of breath. Negative for cough, chest tightness and wheezing.   Cardiovascular: Negative for chest pain, palpitations and leg swelling.  Gastrointestinal: Negative for nausea, vomiting, abdominal pain, diarrhea, constipation, blood in stool and abdominal distention.  Genitourinary: Negative for hematuria and difficulty urinating.  Musculoskeletal: Negative for myalgias and arthralgias.  Skin: Negative for rash.  Neurological: Negative for dizziness, seizures, syncope and headaches.  Hematological: Does not bruise/bleed easily.  Psychiatric/Behavioral: Negative for dysphoric mood. The patient is not nervous/anxious.        Objective:    Physical Exam  Nursing note and vitals reviewed. Constitutional: He is oriented to person, place, and time. He appears well-developed and well-nourished. No distress.  HENT:  Head: Normocephalic and atraumatic.  Right Ear: Hearing and external ear normal.  Left Ear: Hearing and external ear normal.  Nose: Nose normal.  Mouth/Throat: Oropharynx is clear and moist. No oropharyngeal exudate.       Unable to visualize TMs. S/p cerumen cleaning this morning with OTC product.  Canals bilaterally filled with soft white wax.  R outer canal with erythema, tender  Eyes: Conjunctivae and EOM are normal. Pupils are equal, round, and reactive to light. No scleral icterus.  Neck: Normal range of motion. Neck supple.  Cardiovascular: Normal rate, regular rhythm, normal heart sounds and intact distal pulses.   No murmur heard. Pulses:      Radial pulses are 2+ on the right side, and 2+ on the left side.  Pulmonary/Chest: Effort normal and breath sounds normal. No respiratory distress. He has no wheezes. He has no rales.  Abdominal: Soft. Bowel sounds are normal. He exhibits no distension and no mass. There is no tenderness. There is no rebound and no guarding.  Genitourinary: Rectum normal and prostate normal. Rectal exam shows no external hemorrhoid, no internal hemorrhoid, no fissure, no mass, no tenderness and anal tone normal. Guaiac negative stool. Prostate is not enlarged and not tender.  Musculoskeletal: Normal range of motion. He exhibits edema (nonpitting).  Lymphadenopathy:    He has no cervical adenopathy.  Neurological: He is alert and oriented to person, place, and time.       CN grossly intact, station and gait intact  Skin: Skin is warm and dry. No rash noted.  Psychiatric: He has a normal mood and affect. His behavior is normal. Judgment and thought content normal.       Assessment & Plan:

## 2012-06-25 NOTE — Assessment & Plan Note (Signed)
Chronic, no acute change. Discussed compression stockings, pt opts to monitor for now.

## 2012-06-25 NOTE — Assessment & Plan Note (Signed)
Chronic, stable. Continue to monitor.  

## 2012-06-25 NOTE — Patient Instructions (Signed)
Stool kit today. Prostate exam reassuring. Keep an eye on sugars - minimize added sugars, sweets, stay away from white starches. Return in 1 year for next physical, sooner as needed. Script for ear drops to hold on to in case not improving as expected.

## 2012-06-25 NOTE — Assessment & Plan Note (Addendum)
Seems stable from this standpoint.  Continue to monitor.  Discussed to seek care if any acute CP/tightness or dyspnea.

## 2012-06-25 NOTE — Assessment & Plan Note (Signed)
Chronic, stable 

## 2012-06-25 NOTE — Assessment & Plan Note (Signed)
Mild. Could just be irritation after ear cleaning this morning. If not better, treat with vosol HC drops to cover external otitis. If worse, return for re evaluation.

## 2012-06-25 NOTE — Assessment & Plan Note (Addendum)
Lab Results  Component Value Date   HGBA1C 6.1 06/18/2012  discussed prediabetes and importance of avoiding added sugars, sweets, white starches.Marland Kitchen

## 2012-06-25 NOTE — Assessment & Plan Note (Signed)
PSA/DRE stable.  

## 2012-07-14 ENCOUNTER — Other Ambulatory Visit: Payer: BC Managed Care – PPO

## 2012-07-14 DIAGNOSIS — Z1211 Encounter for screening for malignant neoplasm of colon: Secondary | ICD-10-CM

## 2012-07-17 ENCOUNTER — Encounter: Payer: Self-pay | Admitting: *Deleted

## 2013-01-14 ENCOUNTER — Other Ambulatory Visit: Payer: Self-pay

## 2013-01-14 MED ORDER — SILDENAFIL CITRATE 100 MG PO TABS: 100.0000 mg | ORAL_TABLET | ORAL | Status: DC | PRN

## 2013-01-14 NOTE — Telephone Encounter (Signed)
Pt left v/m requesting [facility] [address].Please advise.

## 2013-01-14 NOTE — Telephone Encounter (Signed)
Sent in.plz notify pt.  

## 2013-03-08 ENCOUNTER — Other Ambulatory Visit: Payer: Self-pay | Admitting: Family Medicine

## 2013-07-07 ENCOUNTER — Other Ambulatory Visit: Payer: Self-pay | Admitting: Family Medicine

## 2013-08-04 ENCOUNTER — Other Ambulatory Visit: Payer: Self-pay | Admitting: Family Medicine

## 2013-08-04 DIAGNOSIS — R7303 Prediabetes: Secondary | ICD-10-CM

## 2013-08-04 DIAGNOSIS — Z125 Encounter for screening for malignant neoplasm of prostate: Secondary | ICD-10-CM

## 2013-08-04 DIAGNOSIS — I10 Essential (primary) hypertension: Secondary | ICD-10-CM

## 2013-08-04 DIAGNOSIS — E7800 Pure hypercholesterolemia, unspecified: Secondary | ICD-10-CM

## 2013-08-05 ENCOUNTER — Other Ambulatory Visit (INDEPENDENT_AMBULATORY_CARE_PROVIDER_SITE_OTHER): Payer: BC Managed Care – PPO

## 2013-08-05 DIAGNOSIS — E7800 Pure hypercholesterolemia, unspecified: Secondary | ICD-10-CM

## 2013-08-05 DIAGNOSIS — I10 Essential (primary) hypertension: Secondary | ICD-10-CM

## 2013-08-05 DIAGNOSIS — Z125 Encounter for screening for malignant neoplasm of prostate: Secondary | ICD-10-CM

## 2013-08-05 DIAGNOSIS — R7309 Other abnormal glucose: Secondary | ICD-10-CM

## 2013-08-05 DIAGNOSIS — R7303 Prediabetes: Secondary | ICD-10-CM

## 2013-08-05 LAB — LIPID PANEL
HDL: 77.8 mg/dL (ref >39.00)
Total CHOL/HDL Ratio: 3
Triglycerides: 177 mg/dL — ABNORMAL HIGH (ref 0.0–149.0)

## 2013-08-05 LAB — BASIC METABOLIC PANEL
Calcium: 9.3 mg/dL (ref 8.4–10.5)
GFR: 105.77 mL/min (ref >60.00)
Glucose, Bld: 99 mg/dL (ref 70–99)
Potassium: 3.9 mEq/L (ref 3.5–5.1)
Sodium: 138 mEq/L (ref 135–145)

## 2013-08-05 LAB — HEMOGLOBIN A1C: Hgb A1c MFr Bld: 6.3 % (ref 4.6–6.5)

## 2013-08-05 LAB — LDL CHOLESTEROL, DIRECT: Direct LDL: 106 mg/dL

## 2013-08-05 LAB — PSA: PSA: 0.68 ng/mL (ref 0.10–4.00)

## 2013-08-12 ENCOUNTER — Ambulatory Visit (INDEPENDENT_AMBULATORY_CARE_PROVIDER_SITE_OTHER): Payer: BC Managed Care – PPO | Admitting: Family Medicine

## 2013-08-12 ENCOUNTER — Encounter: Payer: Self-pay | Admitting: Family Medicine

## 2013-08-12 VITALS — BP 128/82 | HR 72 | Temp 98.7°F | Ht 70.0 in | Wt 315.0 lb

## 2013-08-12 DIAGNOSIS — R7303 Prediabetes: Secondary | ICD-10-CM

## 2013-08-12 DIAGNOSIS — E7800 Pure hypercholesterolemia, unspecified: Secondary | ICD-10-CM

## 2013-08-12 DIAGNOSIS — Z1211 Encounter for screening for malignant neoplasm of colon: Secondary | ICD-10-CM

## 2013-08-12 DIAGNOSIS — I10 Essential (primary) hypertension: Secondary | ICD-10-CM

## 2013-08-12 DIAGNOSIS — Z0000 Encounter for general adult medical examination without abnormal findings: Secondary | ICD-10-CM

## 2013-08-12 DIAGNOSIS — R7309 Other abnormal glucose: Secondary | ICD-10-CM

## 2013-08-12 DIAGNOSIS — E669 Obesity, unspecified: Secondary | ICD-10-CM

## 2013-08-12 NOTE — Assessment & Plan Note (Signed)
Chronic, stable on amlodipine. Continue med.

## 2013-08-12 NOTE — Assessment & Plan Note (Signed)
Discussed prediabetes.

## 2013-08-12 NOTE — Progress Notes (Signed)
Subjective:    Patient ID: Philip Middleton, male    DOB: [birth year], 57 y.o.   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 Wt Readings from Last 3 Encounters:  08/12/13 315 lb (142.883 kg)  06/25/12 319 lb 4 oz (144.811 kg)  02/27/12 313 lb 4 oz (142.089 kg)    Preventative:  Tetanus 2012  Flu shot - doesn't receive. Has at work if decides to receive Colon screening - has had normal stool kits in past, would like to repeat this year.  Prostate screening - uncle with prostate cancer. Gets checked yearly.   Caffeine: 2-3 cups coffee/day  Lives alone, no pets, 1 child  Occupation: Press photographer, warehouse  Activity: stays active at work, no regular exercise  Diet: some water, some fruits/vegetables   Medications and allergies reviewed and updated in chart.  Past histories reviewed and updated if relevant as below. Patient Active Problem List   Diagnosis Date Noted  . Healthcare maintenance 06/25/2012  . External otitis of right ear 06/25/2012  . Right knee pain 04/18/2011  . History of pulmonary embolism 10/13/2008  . AC VENUS EMBO & THROMB UNSPEC DEEP VES LOWER EXT 10/13/2008  . Edema 10/13/2008  . Prediabetes 07/20/2008  . OBESITY NOS 07/18/2007  . ERECTILE DYSFUNCTION, ORGANIC 07/18/2007  . HYPERCHOLESTEROLEMIA 04/02/2007  . HYPERTENSION 04/02/2007  . PROSTATITIS, CHRONIC 04/02/2007  . BENIGN PROSTATIC HYPERTROPHY, HX OF 04/02/2007   Past Medical History  Diagnosis Date  . Bilateral pulmonary embolism 2009    with pulmonary infarcts s/p 6+ mo coumadin  . Hypertension   . Hyperlipemia   . Peri-rectal abscess     repair x 4  . left thumb ORIF     fracture   Past Surgical History  Procedure Laterality Date  . Le venous u/s nml  03/29/09    L residual thrombus in SFV and poplitael veins  . Le venous u/s nml  10/2009    no superficial or deep  thrombus   History  Substance Use Topics  . Smoking status: Former Smoker    Types: Cigars  . Smokeless tobacco: Never Used     Comment: smoked 1-2 daily, quit 2005  . Alcohol Use: Yes     Comment: occassionally   Family History  Problem Relation Age of Onset  . Cancer Mother     metastatic cancer, ? ovarian  . Sleep apnea Brother   . Diabetes Sister   . Cancer Maternal Uncle 77    prostate   No Known Allergies Current Outpatient Prescriptions on File Prior to Visit  Medication Sig Dispense Refill  . amLODipine (NORVASC) 10 MG tablet TAKE 1 TABLET DAILY  30 tablet  6  . Multiple Vitamin (MULTIVITAMIN) tablet Take 1 tablet by mouth daily.        . pravastatin (PRAVACHOL) 80 MG tablet TAKE 1 TABLET AT BEDTIME  30 tablet  0  . sildenafil (VIAGRA) 100 MG tablet Take 1 tablet (100 mg total) by mouth as needed for erectile dysfunction. Take one by mouth one hour prior  10 tablet  3   No current facility-administered medications on file prior to visit.     Review of Systems  Constitutional: Negative for fever, chills, activity change, appetite change, fatigue and unexpected weight change.  HENT: Negative for hearing loss and neck pain.   Eyes: Negative for visual  disturbance.  Respiratory: Positive for shortness of breath (chronic). Negative for cough, chest tightness and wheezing.   Cardiovascular: Positive for leg swelling (bilateral, chronic). Negative for chest pain and palpitations.  Gastrointestinal: Negative for nausea, vomiting, abdominal pain, diarrhea, constipation, blood in stool and abdominal distention.  Genitourinary: Negative for hematuria and difficulty urinating.  Musculoskeletal: Negative for myalgias and arthralgias.  Skin: Negative for rash.  Neurological: Negative for dizziness, seizures, syncope and headaches.  Hematological: Negative for adenopathy. Does not bruise/bleed easily.  Psychiatric/Behavioral: Negative for dysphoric mood. The patient is not  nervous/anxious.        Objective:   Physical Exam  Nursing note and vitals reviewed. Constitutional: He is oriented to person, place, and time. He appears well-developed and well-nourished. No distress.  HENT:  Head: Normocephalic and atraumatic.  Right Ear: Hearing, tympanic membrane, external ear and ear canal normal.  Left Ear: Hearing, tympanic membrane, external ear and ear canal normal.  Nose: Nose normal.  Mouth/Throat: Oropharynx is clear and moist. No oropharyngeal exudate.  Eyes: Conjunctivae and EOM are normal. Pupils are equal, round, and reactive to light. No scleral icterus.  Neck: Normal range of motion. Neck supple. No thyromegaly present.  Cardiovascular: Normal rate, regular rhythm, normal heart sounds and intact distal pulses.   No murmur heard. Pulses:      Radial pulses are 2+ on the right side, and 2+ on the left side.  Pulmonary/Chest: Effort normal and breath sounds normal. No respiratory distress. He has no wheezes. He has no rales.  Abdominal: Soft. Bowel sounds are normal. He exhibits no distension and no mass. There is no tenderness. There is no rebound and no guarding.  Genitourinary: Rectum normal and prostate normal. Rectal exam shows no external hemorrhoid, no internal hemorrhoid, no fissure, no mass, no tenderness and anal tone normal. Prostate is not enlarged (20gm) and not tender.  Musculoskeletal: Normal range of motion. He exhibits edema (nonpitting).  Lymphadenopathy:    He has no cervical adenopathy.  Neurological: He is alert and oriented to person, place, and time.  CN grossly intact, station and gait intact  Skin: Skin is warm and dry. No rash noted.  Psychiatric: He has a normal mood and affect. His behavior is normal. Judgment and thought content normal.      Assessment & Plan:

## 2013-08-12 NOTE — Patient Instructions (Signed)
Stool kit today Work on weight loss to help leg pain and sugars. Good to see you today, call us with questions.

## 2013-08-12 NOTE — Assessment & Plan Note (Signed)
Preventative protocols reviewed and updated unless pt declined. Discussed healthy diet and lifestyle.  Reviewed blood work. Declines flu shot iFOB today.

## 2013-08-12 NOTE — Assessment & Plan Note (Signed)
Stable on current regimen - discussed dietary changes to lower triglycerides

## 2013-08-12 NOTE — Assessment & Plan Note (Signed)
encouraged weight loss through daily exercise routine. Body mass index is 45.2 kg/(m^2).

## 2013-08-15 ENCOUNTER — Other Ambulatory Visit: Payer: Self-pay | Admitting: Family Medicine

## 2013-08-16 ENCOUNTER — Encounter: Payer: Self-pay | Admitting: Family Medicine

## 2013-08-24 ENCOUNTER — Other Ambulatory Visit: Payer: BC Managed Care – PPO

## 2013-08-24 DIAGNOSIS — Z1211 Encounter for screening for malignant neoplasm of colon: Secondary | ICD-10-CM

## 2013-08-24 LAB — FECAL OCCULT BLOOD, IMMUNOCHEMICAL: Fecal Occult Bld: POSITIVE

## 2013-08-25 ENCOUNTER — Other Ambulatory Visit: Payer: Self-pay | Admitting: Family Medicine

## 2013-08-25 DIAGNOSIS — R195 Other fecal abnormalities: Secondary | ICD-10-CM

## 2013-08-28 ENCOUNTER — Encounter: Payer: Self-pay | Admitting: Internal Medicine

## 2013-10-09 ENCOUNTER — Other Ambulatory Visit: Payer: Self-pay | Admitting: Family Medicine

## 2013-10-26 HISTORY — PX: COLONOSCOPY: SHX174

## 2013-10-30 ENCOUNTER — Ambulatory Visit (AMBULATORY_SURGERY_CENTER): Payer: Self-pay

## 2013-10-30 VITALS — Ht 71.0 in | Wt 304.0 lb

## 2013-10-30 DIAGNOSIS — Z1211 Encounter for screening for malignant neoplasm of colon: Secondary | ICD-10-CM

## 2013-10-30 MED ORDER — SUPREP BOWEL PREP KIT 17.5-3.13-1.6 GM/177ML PO SOLN: 1.0000 | Freq: Once | ORAL | Status: DC

## 2013-11-02 ENCOUNTER — Encounter: Payer: Self-pay | Admitting: Internal Medicine

## 2013-11-13 ENCOUNTER — Ambulatory Visit (AMBULATORY_SURGERY_CENTER): Payer: BC Managed Care – PPO | Admitting: Internal Medicine

## 2013-11-13 ENCOUNTER — Encounter: Payer: Self-pay | Admitting: Internal Medicine

## 2013-11-13 VITALS — BP 123/64 | HR 61 | Temp 98.3°F | Resp 19 | Ht 71.0 in | Wt 304.0 lb

## 2013-11-13 DIAGNOSIS — Z860100 Personal history of colon polyps, unspecified: Secondary | ICD-10-CM

## 2013-11-13 DIAGNOSIS — Z1211 Encounter for screening for malignant neoplasm of colon: Secondary | ICD-10-CM

## 2013-11-13 DIAGNOSIS — D126 Benign neoplasm of colon, unspecified: Secondary | ICD-10-CM

## 2013-11-13 HISTORY — DX: Personal history of colonic polyps: Z86.010

## 2013-11-13 HISTORY — DX: Personal history of colon polyps, unspecified: Z86.0100

## 2013-11-13 MED ORDER — SODIUM CHLORIDE 0.9 % IV SOLN: 500.0000 mL | INTRAVENOUS | Status: DC

## 2013-11-13 NOTE — Progress Notes (Signed)
Called to room to assist during endoscopic procedure.  Patient ID and intended procedure confirmed with present staff. Received instructions for my participation in the procedure from the performing physician.  

## 2013-11-13 NOTE — Progress Notes (Signed)
Procedure ends, to recovery, report given and VSS. 

## 2013-11-13 NOTE — Op Note (Signed)
[facility] [address] [place], [address]   COLONOSCOPY PROCEDURE REPORT  PATIENT: Philip Middleton, Philip Middleton  MR#: [id number] BIRTHDATE: Nov 09, 1956 , 57  yrs. old GENDER: Male ENDOSCOPIST: Iva Boop, MD, [facility] REFERRED QI:ONGEXB Sharen Hones, M.D. PROCEDURE DATE:  11/13/2013 PROCEDURE:   Colonoscopy with snare polypectomy First Screening Colonoscopy - Avg.  risk and is 50 yrs.  old or older Yes.  Prior Negative Screening - Now for repeat screening. N/A  History of Adenoma - Now for follow-up colonoscopy & has been > or = to 3 yrs.  N/A  Polyps Removed Today? Yes. ASA CLASS:   Class II INDICATIONS:average risk screening and first colonoscopy. MEDICATIONS: propofol (Diprivan) 250mg  IV, MAC sedation, administered by CRNA, and These medications were titrated to patient response per physician's verbal order  DESCRIPTION OF PROCEDURE:   After the risks benefits and alternatives of the procedure were thoroughly explained, informed consent was obtained.  A digital rectal exam revealed no abnormalities of the rectum, A digital rectal exam revealed no prostatic nodules, and A digital rectal exam revealed the prostate was not enlarged.   The [id number]  endoscope was introduced through the anus and advanced to the cecum, which was identified by both the appendix and ileocecal valve. No adverse events experienced.   The quality of the prep was excellent using Suprep  The instrument was then slowly withdrawn as the colon was fully examined.  COLON FINDINGS: Five diminutive sessile polyps were found in the ascending colon, transverse colon, descending colon, and sigmoid colon.  A polypectomy was performed with a cold snare.  The resection was complete and the polyp tissue was completely retrieved.   The colon mucosa was otherwise normal.   A right colon retroflexion was performed.  Retroflexed views revealed no abnormalities. The time to cecum=1 minutes 47 seconds.   Withdrawal time=12 minutes 59 seconds.  The scope was withdrawn and the procedure completed. COMPLICATIONS: There were no complications.  ENDOSCOPIC IMPRESSION: 1.   Five diminutive sessile polyps were found in the ascending colon, transverse colon, descending colon, and sigmoid colon; polypectomy was performed with a cold snare 2.   The colon mucosa was otherwise normal - excellent prep - first colonoscopy  RECOMMENDATIONS: Timing of repeat colonoscopy will be determined by pathology findings.   eSigned:  Iva Boop, MD, [facility] 11/13/2013 12:15 PM   cc: Eustaquio Boyden MD and The Patient

## 2013-11-13 NOTE — Progress Notes (Signed)
Patient did not experience any of the following events: a burn prior to discharge; a fall within the facility; wrong site/side/patient/procedure/implant event; or a hospital transfer or hospital admission upon discharge from the facility. (G8907) Patient did not have preoperative order for IV antibiotic SSI prophylaxis. (G8918)  

## 2013-11-13 NOTE — Patient Instructions (Addendum)
There were 6 tiny polyps seen - I removed them. Everything else was ok.  I will let you know pathology results and when to have another routine colonoscopy by mail.  I appreciate the opportunity to care for you. Iva Boop, MD, FACG   YOU HAD AN ENDOSCOPIC PROCEDURE TODAY AT THE [facility]: Refer to the procedure report that was given to you for any specific questions about what was found during the examination.  If the procedure report does not answer your questions, please call your gastroenterologist to clarify.  If you requested that your care partner not be given the details of your procedure findings, then the procedure report has been included in a sealed envelope for you to review at your convenience later.  YOU SHOULD EXPECT: Some feelings of bloating in the abdomen. Passage of more gas than usual.  Walking can help get rid of the air that was put into your GI tract during the procedure and reduce the bloating. If you had a lower endoscopy (such as a colonoscopy or flexible sigmoidoscopy) you may notice spotting of blood in your stool or on the toilet paper. If you underwent a bowel prep for your procedure, then you may not have a normal bowel movement for a few days.  DIET: Your first meal following the procedure should be a light meal and then it is ok to progress to your normal diet.  A half-sandwich or bowl of soup is an example of a good first meal.  Heavy or fried foods are harder to digest and may make you feel nauseous or bloated.  Likewise meals heavy in dairy and vegetables can cause extra gas to form and this can also increase the bloating.  Drink plenty of fluids but you should avoid alcoholic beverages for 24 hours.  ACTIVITY: Your care partner should take you home directly after the procedure.  You should plan to take it easy, moving slowly for the rest of the day.  You can resume normal activity the day after the procedure however you should NOT DRIVE or use  heavy machinery for 24 hours (because of the sedation medicines used during the test).    SYMPTOMS TO REPORT IMMEDIATELY: A gastroenterologist can be reached at any hour.  During normal business hours, 8:30 AM to 5:00 PM Monday through Friday, call [phone].  After hours and on weekends, please call the GI answering service at [phone] who will take a message and have the physician on call contact you.   Following lower endoscopy (colonoscopy or flexible sigmoidoscopy):  Excessive amounts of blood in the stool  Significant tenderness or worsening of abdominal pains  Swelling of the abdomen that is new, acute  Fever of 100F or higher  Following upper endoscopy (EGD)  Vomiting of blood or coffee ground material  New chest pain or pain under the shoulder blades  Painful or persistently difficult swallowing  New shortness of breath  Fever of 100F or higher  Black, tarry-looking stools  FOLLOW UP: If any biopsies were taken you will be contacted by phone or by letter within the next 1-3 weeks.  Call your gastroenterologist if you have not heard about the biopsies in 3 weeks.  Our staff will call the home number listed on your records the next business day following your procedure to check on you and address any questions or concerns that you may have at that time regarding the information given to you following your procedure. This is  a courtesy call and so if there is no answer at the home number and we have not heard from you through the emergency physician on call, we will assume that you have returned to your regular daily activities without incident.  SIGNATURES/CONFIDENTIALITY: You and/or your care partner have signed paperwork which will be entered into your electronic medical record.  These signatures attest to the fact that that the information above on your After Visit Summary has been reviewed and is understood.  Full responsibility of the confidentiality of this  discharge information lies with you and/or your care-partner.   Information on polyps given to you today

## 2013-11-16 ENCOUNTER — Telehealth: Payer: Self-pay

## 2013-11-16 NOTE — Telephone Encounter (Signed)
Left a message on the pt's answering machine at [phone] to call us back if he has any questions or concerns. Maw

## 2013-11-22 ENCOUNTER — Encounter: Payer: Self-pay | Admitting: Internal Medicine

## 2013-11-22 NOTE — Progress Notes (Signed)
Quick Note:  4 diminutive adenomas repeat colon 2017 ______ 

## 2013-11-24 ENCOUNTER — Encounter: Payer: Self-pay | Admitting: Family Medicine

## 2014-01-06 ENCOUNTER — Telehealth: Payer: Self-pay | Admitting: Family Medicine

## 2014-01-06 MED ORDER — PRAVASTATIN SODIUM 80 MG PO TABS: ORAL_TABLET | ORAL | Status: DC

## 2014-01-06 MED ORDER — AMLODIPINE BESYLATE 10 MG PO TABS: ORAL_TABLET | ORAL | Status: DC

## 2014-01-06 NOTE — Telephone Encounter (Signed)
Rxs refilled as requested 

## 2014-01-06 NOTE — Telephone Encounter (Signed)
Pt is needing refill onB/P meds and cholesterol pt is needing 90 day supply. [facility] [address]

## 2014-03-08 ENCOUNTER — Other Ambulatory Visit: Payer: Self-pay | Admitting: Family Medicine

## 2014-03-17 ENCOUNTER — Other Ambulatory Visit: Payer: Self-pay

## 2014-03-17 MED ORDER — ATORVASTATIN CALCIUM 80 MG PO TABS: ORAL_TABLET | ORAL | Status: DC

## 2014-03-17 NOTE — Telephone Encounter (Signed)
Pt request atorvastatin 80 mg refill changed to # 90 x 3 refills [facility] [place]. [facility] and [facility] and # refills changed as requested. Atorvastatin 80 mg # 30 with #11 refills cancelled. Pt notified done.

## 2014-04-14 ENCOUNTER — Other Ambulatory Visit: Payer: Self-pay | Admitting: Family Medicine

## 2014-07-09 ENCOUNTER — Other Ambulatory Visit: Payer: Self-pay | Admitting: Family Medicine

## 2014-10-06 ENCOUNTER — Other Ambulatory Visit: Payer: Self-pay | Admitting: Family Medicine

## 2014-10-12 ENCOUNTER — Other Ambulatory Visit: Payer: Self-pay | Admitting: Family Medicine

## 2014-10-12 DIAGNOSIS — R7303 Prediabetes: Secondary | ICD-10-CM

## 2014-10-12 DIAGNOSIS — Z125 Encounter for screening for malignant neoplasm of prostate: Secondary | ICD-10-CM

## 2014-10-12 DIAGNOSIS — E7800 Pure hypercholesterolemia, unspecified: Secondary | ICD-10-CM

## 2014-10-12 DIAGNOSIS — N400 Enlarged prostate without lower urinary tract symptoms: Secondary | ICD-10-CM

## 2014-10-12 DIAGNOSIS — I10 Essential (primary) hypertension: Secondary | ICD-10-CM

## 2014-10-13 ENCOUNTER — Other Ambulatory Visit (INDEPENDENT_AMBULATORY_CARE_PROVIDER_SITE_OTHER): Payer: BC Managed Care – PPO

## 2014-10-13 DIAGNOSIS — R7303 Prediabetes: Secondary | ICD-10-CM

## 2014-10-13 DIAGNOSIS — Z125 Encounter for screening for malignant neoplasm of prostate: Secondary | ICD-10-CM

## 2014-10-13 DIAGNOSIS — E7800 Pure hypercholesterolemia, unspecified: Secondary | ICD-10-CM

## 2014-10-13 DIAGNOSIS — I10 Essential (primary) hypertension: Secondary | ICD-10-CM

## 2014-10-13 DIAGNOSIS — R7309 Other abnormal glucose: Secondary | ICD-10-CM

## 2014-10-13 LAB — LIPID PANEL
CHOL/HDL RATIO: 3
Cholesterol: 157 mg/dL (ref 0–200)
HDL: 61 mg/dL (ref >39.00)
LDL Cholesterol: 80 mg/dL (ref 0–99)
NONHDL: 96
Triglycerides: 78 mg/dL (ref 0.0–149.0)
VLDL: 15.6 mg/dL (ref 0.0–40.0)

## 2014-10-13 LAB — PSA: PSA: 0.55 ng/mL (ref 0.10–4.00)

## 2014-10-13 LAB — COMPREHENSIVE METABOLIC PANEL
ALT: 21 U/L (ref 0–53)
AST: 17 U/L (ref 0–37)
Albumin: 4.1 g/dL (ref 3.5–5.2)
Alkaline Phosphatase: 67 U/L (ref 39–117)
BILIRUBIN TOTAL: 0.9 mg/dL (ref 0.2–1.2)
BUN: 17 mg/dL (ref 6–23)
CO2: 30 meq/L (ref 19–32)
Calcium: 9.4 mg/dL (ref 8.4–10.5)
Chloride: 106 mEq/L (ref 96–112)
Creatinine, Ser: 1 mg/dL (ref 0.4–1.5)
GFR: 93.74 mL/min (ref >60.00)
Glucose, Bld: 106 mg/dL — ABNORMAL HIGH (ref 70–99)
Potassium: 4.2 mEq/L (ref 3.5–5.1)
Sodium: 143 mEq/L (ref 135–145)
TOTAL PROTEIN: 6.9 g/dL (ref 6.0–8.3)

## 2014-10-13 LAB — HEMOGLOBIN A1C: HEMOGLOBIN A1C: 6.3 % (ref 4.6–6.5)

## 2014-10-20 ENCOUNTER — Other Ambulatory Visit: Payer: BC Managed Care – PPO

## 2014-10-27 ENCOUNTER — Ambulatory Visit (INDEPENDENT_AMBULATORY_CARE_PROVIDER_SITE_OTHER): Payer: BC Managed Care – PPO | Admitting: Family Medicine

## 2014-10-27 ENCOUNTER — Encounter: Payer: Self-pay | Admitting: Family Medicine

## 2014-10-27 VITALS — BP 144/84 | HR 68 | Temp 98.7°F | Ht 70.0 in | Wt 325.0 lb

## 2014-10-27 DIAGNOSIS — R7303 Prediabetes: Secondary | ICD-10-CM

## 2014-10-27 DIAGNOSIS — E785 Hyperlipidemia, unspecified: Secondary | ICD-10-CM

## 2014-10-27 DIAGNOSIS — Z0000 Encounter for general adult medical examination without abnormal findings: Secondary | ICD-10-CM

## 2014-10-27 DIAGNOSIS — I10 Essential (primary) hypertension: Secondary | ICD-10-CM

## 2014-10-27 DIAGNOSIS — N400 Enlarged prostate without lower urinary tract symptoms: Secondary | ICD-10-CM

## 2014-10-27 MED ORDER — ATORVASTATIN CALCIUM 80 MG PO TABS: ORAL_TABLET | ORAL | Status: DC

## 2014-10-27 MED ORDER — AMLODIPINE BESYLATE 10 MG PO TABS: ORAL_TABLET | ORAL | Status: DC

## 2014-10-27 NOTE — Patient Instructions (Signed)
Good to see you today Return as needed or in 1 year for next physical. Work on decreased added sugars in diet, decreased sweetened beverages, watch simple carbs. Work on weight loss to continue controlling sugars.

## 2014-10-27 NOTE — Assessment & Plan Note (Signed)
Reviewed #s and diet changes to keep sugars below diabetic range. encouraged weight loss as well.

## 2014-10-27 NOTE — Assessment & Plan Note (Signed)
Continue atorvastatin 80mg  daily. Well controlled. Pt denies myalgias.

## 2014-10-27 NOTE — Assessment & Plan Note (Signed)
Preventative protocols reviewed and updated unless pt declined. Discussed healthy diet and lifestyle.  

## 2014-10-27 NOTE — Progress Notes (Signed)
Pre visit review using our clinic review tool, if applicable. No additional management support is needed unless otherwise documented below in the visit note. 

## 2014-10-27 NOTE — Assessment & Plan Note (Signed)
Body mass index is 46.63 kg/(m^2).  Reviewed diet changes to affect weight loss.

## 2014-10-27 NOTE — Assessment & Plan Note (Signed)
Not significantly evident on exam today.

## 2014-10-27 NOTE — Progress Notes (Signed)
BP 144/84 mmHg  Pulse 68  Temp(Src) 98.7 F (37.1 C) (Oral)  Ht 5\' 10"  (1.778 m)  Wt 325 lb (147.419 kg)  BMI 46.63 kg/m2   CC: CPE  Subjective:    Patient ID: Philip Middleton, male    DOB: Mar 08, 1956, 58 y.o.   MRN: [id number]  HPI: Philip Middleton is a 58 y.o. male presenting on 10/27/2014 for Annual Exam   Brother with OSA. Pt endorses restorative sleeping, denies PNDyspnea.  Preventative: COLONOSCOPY Date: 10/2013 4 diminutive polyps, rpt 3 yrs [name]) Prostate screening - uncle with prostate cancer. Gets checked yearly.  Tetanus 2012  Flu at work Seat belt use discussed  Caffeine: 2-3 cups coffee/day  Lives alone, no pets, 1 child  Occupation: Proofreader, warehouse  Activity: stays active at work, no regular exercise Diet: some water, some fruits/vegetables   Relevant past medical, surgical, family and social history reviewed and updated as indicated. Interim medical history since our last visit reviewed. Allergies and medications reviewed and updated.  Current Outpatient Prescriptions on File Prior to Visit  Medication Sig  . Multiple Vitamin (MULTIVITAMIN) tablet Take 1 tablet by mouth daily.    . sildenafil (VIAGRA) 100 MG tablet Take 1 tablet (100 mg total) by mouth as needed for erectile dysfunction. Take one by mouth one hour prior   No current facility-administered medications on file prior to visit.    Review of Systems  Constitutional: Negative for fever, chills, activity change, appetite change, fatigue and unexpected weight change.  HENT: Negative for hearing loss.   Eyes: Negative for visual disturbance.  Respiratory: Negative for cough, chest tightness and wheezing. Shortness of breath: stays dyspneic since PE.   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 Negative for chest pain and palpitations.  Gastrointestinal: Negative for nausea, vomiting, abdominal pain, diarrhea, constipation, blood in stool and abdominal  distention.  Genitourinary: Negative for hematuria and difficulty urinating.  Musculoskeletal: Negative for myalgias, arthralgias and neck pain.  Skin: Negative for rash.  Neurological: Negative for dizziness, seizures, syncope and headaches.  Hematological: Negative for adenopathy. Does not bruise/bleed easily.  Psychiatric/Behavioral: Negative for dysphoric mood. The patient is not nervous/anxious.    Per HPI unless specifically indicated above     Objective:    BP 144/84 mmHg  Pulse 68  Temp(Src) 98.7 F (37.1 C) (Oral)  Ht 5\' 10"  (1.778 m)  Wt 325 lb (147.419 kg)  BMI 46.63 kg/m2  Wt Readings from Last 3 Encounters:  10/27/14 325 lb (147.419 kg)  11/13/13 304 lb (137.893 kg)  10/30/13 304 lb (137.893 kg)    Physical Exam  Constitutional: He is oriented to person, place, and time. He appears well-developed and well-nourished. No distress.  HENT:  Head: Normocephalic and atraumatic.  Right Ear: Hearing, tympanic membrane, external ear and ear canal normal.  Left Ear: Hearing, tympanic membrane, external ear and ear canal normal.  Nose: Nose normal.  Mouth/Throat: Uvula is midline, oropharynx is clear and moist and mucous membranes are normal. No oropharyngeal exudate, posterior oropharyngeal edema or posterior oropharyngeal erythema.  Eyes: Conjunctivae and EOM are normal. Pupils are equal, round, and reactive to light. No scleral icterus.  Neck: Normal range of motion. Neck supple. No thyromegaly present.  Cardiovascular: Normal rate, regular rhythm, normal heart sounds and intact distal pulses.   No murmur heard. Pulses:      Radial pulses are 2+ on the right side, and 2+ on the left side.  Pulmonary/Chest: Effort normal and breath  sounds normal. No respiratory distress. He has no wheezes. He has no rales.  Abdominal: Soft. Bowel sounds are normal. He exhibits no distension and no mass. There is no tenderness. There is no rebound and no guarding.  Genitourinary: Rectum  normal and prostate normal. Rectal exam shows no external hemorrhoid, no internal hemorrhoid, no fissure, no mass, no tenderness and anal tone normal. Prostate is not enlarged and not tender.  Musculoskeletal: Normal range of motion. He exhibits no edema.  Lymphadenopathy:    He has no cervical adenopathy.  Neurological: He is alert and oriented to person, place, and time.  CN grossly intact, station and gait intact  Skin: Skin is warm and dry. No rash noted.  Psychiatric: He has a normal mood and affect. His behavior is normal. Judgment and thought content normal.  Nursing note and vitals reviewed.  Results for orders placed or performed in visit on 10/13/14  Lipid panel  Result Value Ref Range   Cholesterol 157 0 - 200 mg/dL   Triglycerides 78.0 0.0 - 149.0 mg/dL   HDL 61.00 >39.00 mg/dL   VLDL 15.6 0.0 - 40.0 mg/dL   LDL Cholesterol 80 0 - 99 mg/dL   Total CHOL/HDL Ratio 3    NonHDL 96.00   Comprehensive metabolic panel  Result Value Ref Range   Sodium 143 135 - 145 mEq/L   Potassium 4.2 3.5 - 5.1 mEq/L   Chloride 106 96 - 112 mEq/L   CO2 30 19 - 32 mEq/L   Glucose, Bld 106 (H) 70 - 99 mg/dL   BUN 17 6 - 23 mg/dL   Creatinine, Ser 1.0 0.4 - 1.5 mg/dL   Total Bilirubin 0.9 0.2 - 1.2 mg/dL   Alkaline Phosphatase 67 39 - 117 U/L   AST 17 0 - 37 U/L   ALT 21 0 - 53 U/L   Total Protein 6.9 6.0 - 8.3 g/dL   Albumin 4.1 3.5 - 5.2 g/dL   Calcium 9.4 8.4 - 10.5 mg/dL   GFR 93.74 >60.00 mL/min  Hemoglobin A1c  Result Value Ref Range   Hgb A1c MFr Bld 6.3 4.6 - 6.5 %  PSA  Result Value Ref Range   PSA 0.55 0.10 - 4.00 ng/mL      Assessment & Plan:   Problem List Items Addressed This Visit    Prediabetes    Reviewed #s and diet changes to keep sugars below diabetic range. encouraged weight loss as well.    Morbid obesity    Body mass index is 46.63 kg/(m^2).  Reviewed diet changes to affect weight loss.    HLD (hyperlipidemia)    Continue atorvastatin 80mg  daily. Well  controlled. Pt denies myalgias.    Relevant Medications      amLODIpine (NORVASC) tablet      atorvastatin (LIPITOR) tablet   Healthcare maintenance - Primary    Preventative protocols reviewed and updated unless pt declined. Discussed healthy diet and lifestyle.     Essential hypertension    Chronic, stable. Continue regimen.    Relevant Medications      amLODIpine (NORVASC) tablet      atorvastatin (LIPITOR) tablet   BPH (benign prostatic hypertrophy)    Not significantly evident on exam today.        Follow up plan: Return in about 1 year (around 10/28/2015), or as needed, for annual exam, prior fasting for blood work.

## 2014-10-27 NOTE — Assessment & Plan Note (Signed)
Chronic, stable. Continue regimen. 

## 2015-08-08 ENCOUNTER — Other Ambulatory Visit: Payer: Self-pay | Admitting: Family Medicine

## 2015-08-08 DIAGNOSIS — R7303 Prediabetes: Secondary | ICD-10-CM

## 2015-08-08 DIAGNOSIS — I10 Essential (primary) hypertension: Secondary | ICD-10-CM

## 2015-08-08 DIAGNOSIS — N400 Enlarged prostate without lower urinary tract symptoms: Secondary | ICD-10-CM

## 2015-08-08 DIAGNOSIS — E785 Hyperlipidemia, unspecified: Secondary | ICD-10-CM

## 2015-08-10 ENCOUNTER — Other Ambulatory Visit (INDEPENDENT_AMBULATORY_CARE_PROVIDER_SITE_OTHER): Payer: BLUE CROSS/BLUE SHIELD

## 2015-08-10 DIAGNOSIS — N400 Enlarged prostate without lower urinary tract symptoms: Secondary | ICD-10-CM | POA: Diagnosis not present

## 2015-08-10 DIAGNOSIS — R7309 Other abnormal glucose: Secondary | ICD-10-CM

## 2015-08-10 DIAGNOSIS — E785 Hyperlipidemia, unspecified: Secondary | ICD-10-CM | POA: Diagnosis not present

## 2015-08-10 DIAGNOSIS — R7303 Prediabetes: Secondary | ICD-10-CM

## 2015-08-10 DIAGNOSIS — I10 Essential (primary) hypertension: Secondary | ICD-10-CM

## 2015-08-10 LAB — BASIC METABOLIC PANEL
BUN: 25 mg/dL — ABNORMAL HIGH (ref 6–23)
CHLORIDE: 105 meq/L (ref 96–112)
CO2: 27 meq/L (ref 19–32)
CREATININE: 1.06 mg/dL (ref 0.40–1.50)
Calcium: 9 mg/dL (ref 8.4–10.5)
GFR: 91.45 mL/min (ref >60.00)
Glucose, Bld: 105 mg/dL — ABNORMAL HIGH (ref 70–99)
POTASSIUM: 3.9 meq/L (ref 3.5–5.1)
Sodium: 143 mEq/L (ref 135–145)

## 2015-08-10 LAB — LIPID PANEL
CHOL/HDL RATIO: 2
CHOLESTEROL: 156 mg/dL (ref 0–200)
HDL: 76.3 mg/dL (ref >39.00)
LDL Cholesterol: 63 mg/dL (ref 0–99)
NonHDL: 79.76
TRIGLYCERIDES: 85 mg/dL (ref 0.0–149.0)
VLDL: 17 mg/dL (ref 0.0–40.0)

## 2015-08-10 LAB — HEMOGLOBIN A1C: Hgb A1c MFr Bld: 6.1 % (ref 4.6–6.5)

## 2015-08-10 LAB — PSA: PSA: 0.63 ng/mL (ref 0.10–4.00)

## 2015-08-17 ENCOUNTER — Encounter: Payer: Self-pay | Admitting: Family Medicine

## 2015-08-17 ENCOUNTER — Ambulatory Visit (INDEPENDENT_AMBULATORY_CARE_PROVIDER_SITE_OTHER): Payer: BLUE CROSS/BLUE SHIELD | Admitting: Family Medicine

## 2015-08-17 VITALS — BP 130/80 | HR 77 | Temp 98.2°F | Ht 70.25 in | Wt 324.0 lb

## 2015-08-17 DIAGNOSIS — I10 Essential (primary) hypertension: Secondary | ICD-10-CM

## 2015-08-17 DIAGNOSIS — Z0000 Encounter for general adult medical examination without abnormal findings: Secondary | ICD-10-CM

## 2015-08-17 DIAGNOSIS — Z860100 Personal history of colon polyps, unspecified: Secondary | ICD-10-CM

## 2015-08-17 DIAGNOSIS — M545 Low back pain, unspecified: Secondary | ICD-10-CM

## 2015-08-17 DIAGNOSIS — N400 Enlarged prostate without lower urinary tract symptoms: Secondary | ICD-10-CM

## 2015-08-17 DIAGNOSIS — R7303 Prediabetes: Secondary | ICD-10-CM

## 2015-08-17 DIAGNOSIS — L731 Pseudofolliculitis barbae: Secondary | ICD-10-CM | POA: Insufficient documentation

## 2015-08-17 DIAGNOSIS — E785 Hyperlipidemia, unspecified: Secondary | ICD-10-CM

## 2015-08-17 DIAGNOSIS — N529 Male erectile dysfunction, unspecified: Secondary | ICD-10-CM

## 2015-08-17 MED ORDER — ATORVASTATIN CALCIUM 80 MG PO TABS: ORAL_TABLET | ORAL | Status: DC

## 2015-08-17 MED ORDER — TADALAFIL 20 MG PO TABS: 20.0000 mg | ORAL_TABLET | Freq: Every day | ORAL | Status: DC | PRN

## 2015-08-17 MED ORDER — AMLODIPINE BESYLATE 10 MG PO TABS: ORAL_TABLET | ORAL | Status: DC

## 2015-08-17 NOTE — Assessment & Plan Note (Signed)
Discussed importance of increased activity in routine for sustainable weight loss.

## 2015-08-17 NOTE — Assessment & Plan Note (Signed)
Again reviewed dx with patient. Encouraged weight loss.

## 2015-08-17 NOTE — Assessment & Plan Note (Signed)
Chronic, stable. Continue lipitor 80mg  daily. Good control based on #s.

## 2015-08-17 NOTE — Assessment & Plan Note (Signed)
Not consistent with prostatitis. Anticipate lumbar strain. rec walking, stretching (exercise provided today) and ice/heat. udpate if not improving with treatment plan.

## 2015-08-17 NOTE — Assessment & Plan Note (Signed)
Will be due for rpt colonoscopy 10/2016.

## 2015-08-17 NOTE — Assessment & Plan Note (Signed)
H/o this in the past - but not evident on exam, denies sxs of BPH, PSA very stable. Continue to monitor.

## 2015-08-17 NOTE — Progress Notes (Signed)
Pre visit review using our clinic review tool, if applicable. No additional management support is needed unless otherwise documented below in the visit note. 

## 2015-08-17 NOTE — Assessment & Plan Note (Signed)
Chronic, stable. Continue current regimen. 

## 2015-08-17 NOTE — Patient Instructions (Addendum)
Incorporate exercise into routine.  Continue lotrimin, try selsun blue shampoo for beard area. I think back pain is from the back - possible lumbar strain. Start walking more regularly and ice/heating pad to back. Try stretching exercises provided today. Try cialis instead of viagra.  Health Maintenance A healthy lifestyle and preventative care can promote health and wellness.  Maintain regular health, dental, and eye exams.  Eat a healthy diet. Foods like vegetables, fruits, whole grains, low-fat dairy products, and lean protein foods contain the nutrients you need and are low in calories. Decrease your intake of foods high in solid fats, added sugars, and salt. Get information about a proper diet from your health care provider, if necessary.  Regular physical exercise is one of the most important things you can do for your health. Most adults should get at least 150 minutes of moderate-intensity exercise (any activity that increases your heart rate and causes you to sweat) each week. In addition, most adults need muscle-strengthening exercises on 2 or more days a week.   Maintain a healthy weight. The body mass index (BMI) is a screening tool to identify possible weight problems. It provides an estimate of body fat based on height and weight. Your health care provider can find your BMI and can help you achieve or maintain a healthy weight. For males 20 years and older:  A BMI below 18.5 is considered underweight.  A BMI of 18.5 to 24.9 is normal.  A BMI of 25 to 29.9 is considered overweight.  A BMI of 30 and above is considered obese.  Maintain normal blood lipids and cholesterol by exercising and minimizing your intake of saturated fat. Eat a balanced diet with plenty of fruits and vegetables. Blood tests for lipids and cholesterol should begin at age 76 and be repeated every 5 years. If your lipid or cholesterol levels are high, you are over age 10, or you are at high risk for heart  disease, you may need your cholesterol levels checked more frequently.Ongoing high lipid and cholesterol levels should be treated with medicines if diet and exercise are not working.  If you smoke, find out from your health care provider how to quit. If you do not use tobacco, do not start.  Lung cancer screening is recommended for adults aged 33-80 years who are at high risk for developing lung cancer because of a history of smoking. A yearly low-dose CT scan of the lungs is recommended for people who have at least a 30-pack-year history of smoking and are current smokers or have quit within the past 15 years. A pack year of smoking is smoking an average of 1 pack of cigarettes a day for 1 year (for example, a 30-pack-year history of smoking could mean smoking 1 pack a day for 30 years or 2 packs a day for 15 years). Yearly screening should continue until the smoker has stopped smoking for at least 15 years. Yearly screening should be stopped for people who develop a health problem that would prevent them from having lung cancer treatment.  If you choose to drink alcohol, do not have more than 2 drinks per day. One drink is considered to be 12 oz (360 mL) of beer, 5 oz (150 mL) of wine, or 1.5 oz (45 mL) of liquor.  Avoid the use of street drugs. Do not share needles with anyone. Ask for help if you need support or instructions about stopping the use of drugs.  High blood pressure causes heart disease  and increases the risk of stroke. Blood pressure should be checked at least every 1-2 years. Ongoing high blood pressure should be treated with medicines if weight loss and exercise are not effective.  If you are 55-55 years old, ask your health care provider if you should take aspirin to prevent heart disease.  Diabetes screening involves taking a blood sample to check your fasting blood sugar level. This should be done once every 3 years after age 19 if you are at a normal weight and without risk  factors for diabetes. Testing should be considered at a younger age or be carried out more frequently if you are overweight and have at least 1 risk factor for diabetes.  Colorectal cancer can be detected and often prevented. Most routine colorectal cancer screening begins at the age of 9 and continues through age 64. However, your health care provider may recommend screening at an earlier age if you have risk factors for colon cancer. On a yearly basis, your health care provider may provide home test kits to check for hidden blood in the stool. A small camera at the end of a tube may be used to directly examine the colon (sigmoidoscopy or colonoscopy) to detect the earliest forms of colorectal cancer. Talk to your health care provider about this at age 60 when routine screening begins. A direct exam of the colon should be repeated every 5-10 years through age 11, unless early forms of precancerous polyps or small growths are found.  People who are at an increased risk for hepatitis B should be screened for this virus. You are considered at high risk for hepatitis B if:  You were born in a country where hepatitis B occurs often. Talk with your health care provider about which countries are considered high risk.  Your parents were born in a high-risk country and you have not received a shot to protect against hepatitis B (hepatitis B vaccine).  You have HIV or AIDS.  You use needles to inject street drugs.  You live with, or have sex with, someone who has hepatitis B.  You are a man who has sex with other men (MSM).  You get hemodialysis treatment.  You take certain medicines for conditions like cancer, organ transplantation, and autoimmune conditions.  Hepatitis C blood testing is recommended for all people born from [birth year] through 1965 and any individual with known risk factors for hepatitis C.  Healthy men should no longer receive prostate-specific antigen (PSA) blood tests as part of  routine cancer screening. Talk to your health care provider about prostate cancer screening.  Testicular cancer screening is not recommended for adolescents or adult males who have no symptoms. Screening includes self-exam, a health care provider exam, and other screening tests. Consult with your health care provider about any symptoms you have or any concerns you have about testicular cancer.  Practice safe sex. Use condoms and avoid high-risk sexual practices to reduce the spread of sexually transmitted infections (STIs).  You should be screened for STIs, including gonorrhea and chlamydia if:  You are sexually active and are younger than 24 years.  You are older than 24 years, and your health care provider tells you that you are at risk for this type of infection.  Your sexual activity has changed since you were last screened, and you are at an increased risk for chlamydia or gonorrhea. Ask your health care provider if you are at risk.  If you are at risk of being infected with  HIV, it is recommended that you take a prescription medicine daily to prevent HIV infection. This is called pre-exposure prophylaxis (PrEP). You are considered at risk if:  You are a man who has sex with other men (MSM).  You are a heterosexual man who is sexually active with multiple partners.  You take drugs by injection.  You are sexually active with a partner who has HIV.  Talk with your health care provider about whether you are at high risk of being infected with HIV. If you choose to begin PrEP, you should first be tested for HIV. You should then be tested every 3 months for as long as you are taking PrEP.  Use sunscreen. Apply sunscreen liberally and repeatedly throughout the day. You should seek shade when your shadow is shorter than you. Protect yourself by wearing long sleeves, pants, a wide-brimmed hat, and sunglasses year round whenever you are outdoors.  Tell your health care provider of new moles  or changes in moles, especially if there is a change in shape or color. Also, tell your health care provider if a mole is larger than the size of a pencil eraser.  A one-time screening for abdominal aortic aneurysm (AAA) and surgical repair of large AAAs by ultrasound is recommended for men aged 40-75 years who are current or former smokers.  Stay current with your vaccines (immunizations). Document Released: 05/10/2008 Document Revised: 11/17/2013 Document Reviewed: 04/09/2011 [facility] Patient Information 2015 [place], [place]. This information is not intended to replace advice given to you by your health care provider. Make sure you discuss any questions you have with your health care provider.

## 2015-08-17 NOTE — Assessment & Plan Note (Signed)
Preventative protocols reviewed and updated unless pt declined. Discussed healthy diet and lifestyle.  

## 2015-08-17 NOTE — Progress Notes (Signed)
BP 130/80 mmHg  Pulse 77  Temp(Src) 98.2 F (36.8 C) (Oral)  Ht 5' 10.25" (1.784 m)  Wt 324 lb (146.965 kg)  BMI 46.18 kg/m2  SpO2 98%   CC: CPE  Subjective:    Patient ID: Philip Middleton, male    DOB: 1956/08/13, 59 y.o.   MRN: [id number]  HPI: Philip Middleton is a 59 y.o. male presenting on 08/17/2015 for Annual Exam   Rash on neck at beard line. Occasionally itchy.   ED - viagra not helpful. Not currently sexually active.  Lower back pain worse with prolonged sitting. No dysuria, fevers,chills, groin pain. Wonders about prostatitis.   Preventative: COLONOSCOPY Date: 10/2013 4 diminutive polyps, rpt 3 yrs [name]) Prostate screening - uncle with prostate cancer. Gets checked yearly.  Tetanus 2012  Flu at work Seat belt use discussed No changing moles on skin  Caffeine: 2-3 cups coffee/day  Lives alone, no pets, 1 child  Occupation: Proofreader, warehouse  Activity: stays active at work, no regular exercise  Diet: good water, daily fruits/vegetables   Relevant past medical, surgical, family and social history reviewed and updated as indicated. Interim medical history since our last visit reviewed. Allergies and medications reviewed and updated. Current Outpatient Prescriptions on File Prior to Visit  Medication Sig  . Multiple Vitamin (MULTIVITAMIN) tablet Take 1 tablet by mouth daily.     No current facility-administered medications on file prior to visit.    Review of Systems  Constitutional: Negative for fever, chills, activity change, appetite change, fatigue and unexpected weight change.  HENT: Negative for hearing loss.   Eyes: Negative for visual disturbance.  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 Negative for cough, chest tightness and wheezing.   Cardiovascular: Positive for leg swelling (chronic). Negative for chest pain and palpitations.  Gastrointestinal: Negative for nausea, vomiting, abdominal pain, diarrhea,  constipation, blood in stool and abdominal distention.  Genitourinary: Negative for hematuria and difficulty urinating.  Musculoskeletal: Negative for myalgias, arthralgias and neck pain.  Skin: Negative for rash.  Neurological: Negative for dizziness, seizures, syncope and headaches.  Hematological: Negative for adenopathy. Does not bruise/bleed easily.  Psychiatric/Behavioral: Negative for dysphoric mood. The patient is not nervous/anxious.    Per HPI unless specifically indicated above     Objective:    BP 130/80 mmHg  Pulse 77  Temp(Src) 98.2 F (36.8 C) (Oral)  Ht 5' 10.25" (1.784 m)  Wt 324 lb (146.965 kg)  BMI 46.18 kg/m2  SpO2 98%  Wt Readings from Last 3 Encounters:  08/17/15 324 lb (146.965 kg)  10/27/14 325 lb (147.419 kg)  11/13/13 304 lb (137.893 kg)    Physical Exam  Constitutional: He is oriented to person, place, and time. He appears well-developed and well-nourished. No distress.  HENT:  Head: Normocephalic and atraumatic.  Right Ear: Hearing, tympanic membrane, external ear and ear canal normal.  Left Ear: Hearing, tympanic membrane, external ear and ear canal normal.  Nose: Nose normal.  Mouth/Throat: Uvula is midline, oropharynx is clear and moist and mucous membranes are normal. No oropharyngeal exudate, posterior oropharyngeal edema or posterior oropharyngeal erythema.  Eyes: Conjunctivae and EOM are normal. Pupils are equal, round, and reactive to light. No scleral icterus.  Neck: Normal range of motion. Neck supple. Carotid bruit is not present. No thyromegaly present.  Cardiovascular: Normal rate, regular rhythm, normal heart sounds and intact distal pulses.   No murmur heard. Pulses:      Radial pulses are 2+  on the right side, and 2+ on the left side.  Pulmonary/Chest: Effort normal and breath sounds normal. No respiratory distress. He has no wheezes. He has no rales.  Abdominal: Soft. Bowel sounds are normal. He exhibits no distension and no mass.  There is no tenderness. There is no rebound and no guarding.  Genitourinary: Rectum normal and prostate normal. Rectal exam shows no external hemorrhoid, no internal hemorrhoid, no fissure, no mass, no tenderness and anal tone normal. Prostate is not enlarged (15gm) and not tender.  Musculoskeletal: Normal range of motion. He exhibits edema (tr).  No midline spine tenderness  Lymphadenopathy:    He has no cervical adenopathy.  Neurological: He is alert and oriented to person, place, and time.  CN grossly intact, station and gait intact  Skin: Skin is warm and dry. Rash noted.  Hyperpigmented macular rash on neck around beard  Psychiatric: He has a normal mood and affect. His behavior is normal. Judgment and thought content normal.  Nursing note and vitals reviewed.  Results for orders placed or performed in visit on 08/10/15  Lipid panel  Result Value Ref Range   Cholesterol 156 0 - 200 mg/dL   Triglycerides 85.0 0.0 - 149.0 mg/dL   HDL 76.30 >39.00 mg/dL   VLDL 17.0 0.0 - 40.0 mg/dL   LDL Cholesterol 63 0 - 99 mg/dL   Total CHOL/HDL Ratio 2    NonHDL 79.76   Hemoglobin A1c  Result Value Ref Range   Hgb A1c MFr Bld 6.1 4.6 - 6.5 %  PSA  Result Value Ref Range   PSA 0.63 0.10 - 4.00 ng/mL  Basic metabolic panel  Result Value Ref Range   Sodium 143 135 - 145 mEq/L   Potassium 3.9 3.5 - 5.1 mEq/L   Chloride 105 96 - 112 mEq/L   CO2 27 19 - 32 mEq/L   Glucose, Bld 105 (H) 70 - 99 mg/dL   BUN 25 (H) 6 - 23 mg/dL   Creatinine, Ser 1.06 0.40 - 1.50 mg/dL   Calcium 9.0 8.4 - 10.5 mg/dL   GFR 91.45 >60.00 mL/min      Assessment & Plan:   Problem List Items Addressed This Visit    Severe obesity (BMI >= 40)    Discussed importance of increased activity in routine for sustainable weight loss.      Pseudofolliculitis barbae    Continue lotrimin. Suggested trial selsun blue. If no improvement, consider trimming vs shaving vs topical corticosteroid.       Prediabetes    Again  reviewed dx with patient. Encouraged weight loss.      Lower back pain    Not consistent with prostatitis. Anticipate lumbar strain. rec walking, stretching (exercise provided today) and ice/heat. udpate if not improving with treatment plan.      HLD (hyperlipidemia)    Chronic, stable. Continue lipitor 80mg  daily. Good control based on #s.      Relevant Medications   amLODipine (NORVASC) 10 MG tablet   atorvastatin (LIPITOR) 80 MG tablet   tadalafil (CIALIS) 20 MG tablet   History of colonic polyps    Will be due for rpt colonoscopy 10/2016.      Healthcare maintenance - Primary    Preventative protocols reviewed and updated unless pt declined. Discussed healthy diet and lifestyle.       Essential hypertension    Chronic, stable. Continue current regimen.      Relevant Medications   amLODipine (NORVASC) 10 MG tablet  atorvastatin (LIPITOR) 80 MG tablet   tadalafil (CIALIS) 20 MG tablet   ERECTILE DYSFUNCTION, ORGANIC    viagra ineffective. Trial cialis.      BPH (benign prostatic hypertrophy)    H/o this in the past - but not evident on exam, denies sxs of BPH, PSA very stable. Continue to monitor.          Follow up plan: Return in about 1 year (around 08/16/2016), or as needed, for annual exam, prior fasting for blood work.

## 2015-08-17 NOTE — Assessment & Plan Note (Signed)
viagra ineffective. Trial cialis.

## 2015-08-17 NOTE — Assessment & Plan Note (Signed)
Continue lotrimin. Suggested trial selsun blue. If no improvement, consider trimming vs shaving vs topical corticosteroid.

## 2016-08-13 ENCOUNTER — Other Ambulatory Visit: Payer: Self-pay | Admitting: Family Medicine

## 2016-08-13 DIAGNOSIS — Z1159 Encounter for screening for other viral diseases: Secondary | ICD-10-CM

## 2016-08-13 DIAGNOSIS — R7303 Prediabetes: Secondary | ICD-10-CM

## 2016-08-13 DIAGNOSIS — N400 Enlarged prostate without lower urinary tract symptoms: Secondary | ICD-10-CM

## 2016-08-13 DIAGNOSIS — E785 Hyperlipidemia, unspecified: Secondary | ICD-10-CM

## 2016-08-13 DIAGNOSIS — I10 Essential (primary) hypertension: Secondary | ICD-10-CM

## 2016-08-14 ENCOUNTER — Other Ambulatory Visit (INDEPENDENT_AMBULATORY_CARE_PROVIDER_SITE_OTHER): Payer: BLUE CROSS/BLUE SHIELD

## 2016-08-14 DIAGNOSIS — Z1159 Encounter for screening for other viral diseases: Secondary | ICD-10-CM

## 2016-08-14 LAB — BASIC METABOLIC PANEL WITH GFR
BUN: 21 mg/dL (ref 6–23)
CO2: 25 meq/L (ref 19–32)
Calcium: 9 mg/dL (ref 8.4–10.5)
Chloride: 104 meq/L (ref 96–112)
Creatinine, Ser: 1.07 mg/dL (ref 0.40–1.50)
GFR: 90.16 mL/min
Glucose, Bld: 97 mg/dL (ref 70–99)
Potassium: 3.5 meq/L (ref 3.5–5.1)
Sodium: 141 meq/L (ref 135–145)

## 2016-08-14 LAB — LIPID PANEL
CHOLESTEROL: 159 mg/dL (ref 0–200)
HDL: 80.5 mg/dL (ref >39.00)
LDL Cholesterol: 65 mg/dL (ref 0–99)
NONHDL: 78.98
Total CHOL/HDL Ratio: 2
Triglycerides: 72 mg/dL (ref 0.0–149.0)
VLDL: 14.4 mg/dL (ref 0.0–40.0)

## 2016-08-14 LAB — PSA: PSA: 0.7 ng/mL (ref 0.10–4.00)

## 2016-08-14 LAB — HEMOGLOBIN A1C: HEMOGLOBIN A1C: 5.9 % (ref 4.6–6.5)

## 2016-08-15 LAB — HEPATITIS C ANTIBODY: HCV Ab: NEGATIVE

## 2016-08-20 ENCOUNTER — Ambulatory Visit (INDEPENDENT_AMBULATORY_CARE_PROVIDER_SITE_OTHER): Payer: BLUE CROSS/BLUE SHIELD | Admitting: Family Medicine

## 2016-08-20 ENCOUNTER — Encounter: Payer: Self-pay | Admitting: Family Medicine

## 2016-08-20 VITALS — BP 136/82 | HR 72 | Temp 98.8°F | Ht 70.25 in | Wt 300.2 lb

## 2016-08-20 DIAGNOSIS — N529 Male erectile dysfunction, unspecified: Secondary | ICD-10-CM

## 2016-08-20 DIAGNOSIS — Z0000 Encounter for general adult medical examination without abnormal findings: Secondary | ICD-10-CM | POA: Diagnosis not present

## 2016-08-20 DIAGNOSIS — I10 Essential (primary) hypertension: Secondary | ICD-10-CM

## 2016-08-20 DIAGNOSIS — E785 Hyperlipidemia, unspecified: Secondary | ICD-10-CM

## 2016-08-20 DIAGNOSIS — Z86010 Personal history of colonic polyps: Secondary | ICD-10-CM

## 2016-08-20 DIAGNOSIS — R7303 Prediabetes: Secondary | ICD-10-CM

## 2016-08-20 DIAGNOSIS — N400 Enlarged prostate without lower urinary tract symptoms: Secondary | ICD-10-CM

## 2016-08-20 MED ORDER — SILDENAFIL CITRATE 100 MG PO TABS: 50.0000 mg | ORAL_TABLET | Freq: Every day | ORAL | 11 refills | Status: AC | PRN

## 2016-08-20 MED ORDER — ATORVASTATIN CALCIUM 40 MG PO TABS: 40.0000 mg | ORAL_TABLET | Freq: Every day | ORAL | 1 refills | Status: DC

## 2016-08-20 NOTE — Assessment & Plan Note (Signed)
Chronic, stable. Continue current regimen. 

## 2016-08-20 NOTE — Assessment & Plan Note (Signed)
viagra and cialis ineffective. Offered uro referral. Pt opts to re trial viagra. Coupon provided. Discussed possible HA, monitor for priapism, avoid all nitrates

## 2016-08-20 NOTE — Patient Instructions (Addendum)
Cut lipitor to 1/2 tablet daily (40mg  daily). You are doing well. Congratulations on weight loss - keep it up! Return as needed or in 1 year for next physical.  Health Maintenance, Male A healthy lifestyle and preventative care can promote health and wellness.  Maintain regular health, dental, and eye exams.  Eat a healthy diet. Foods like vegetables, fruits, whole grains, low-fat dairy products, and lean protein foods contain the nutrients you need and are low in calories. Decrease your intake of foods high in solid fats, added sugars, and salt. Get information about a proper diet from your health care provider, if necessary.  Regular physical exercise is one of the most important things you can do for your health. Most adults should get at least 150 minutes of moderate-intensity exercise (any activity that increases your heart rate and causes you to sweat) each week. In addition, most adults need muscle-strengthening exercises on 2 or more days a week.   Maintain a healthy weight. The body mass index (BMI) is a screening tool to identify possible weight problems. It provides an estimate of body fat based on height and weight. Your health care provider can find your BMI and can help you achieve or maintain a healthy weight. For males 20 years and older:  A BMI below 18.5 is considered underweight.  A BMI of 18.5 to 24.9 is normal.  A BMI of 25 to 29.9 is considered overweight.  A BMI of 30 and above is considered obese.  Maintain normal blood lipids and cholesterol by exercising and minimizing your intake of saturated fat. Eat a balanced diet with plenty of fruits and vegetables. Blood tests for lipids and cholesterol should begin at age 50 and be repeated every 5 years. If your lipid or cholesterol levels are high, you are over age 30, or you are at high risk for heart disease, you may need your cholesterol levels checked more frequently.Ongoing high lipid and cholesterol levels should be  treated with medicines if diet and exercise are not working.  If you smoke, find out from your health care provider how to quit. If you do not use tobacco, do not start.  Lung cancer screening is recommended for adults aged 54-80 years who are at high risk for developing lung cancer because of a history of smoking. A yearly low-dose CT scan of the lungs is recommended for people who have at least a 30-pack-year history of smoking and are current smokers or have quit within the past 15 years. A pack year of smoking is smoking an average of 1 pack of cigarettes a day for 1 year (for example, a 30-pack-year history of smoking could mean smoking 1 pack a day for 30 years or 2 packs a day for 15 years). Yearly screening should continue until the smoker has stopped smoking for at least 15 years. Yearly screening should be stopped for people who develop a health problem that would prevent them from having lung cancer treatment.  If you choose to drink alcohol, do not have more than 2 drinks per day. One drink is considered to be 12 oz (360 mL) of beer, 5 oz (150 mL) of wine, or 1.5 oz (45 mL) of liquor.  Avoid the use of street drugs. Do not share needles with anyone. Ask for help if you need support or instructions about stopping the use of drugs.  High blood pressure causes heart disease and increases the risk of stroke. High blood pressure is more likely to develop  in:  People who have blood pressure in the end of the normal range (100-139/85-89 mm Hg).  People who are overweight or obese.  People who are African American.  If you are 79-4 years of age, have your blood pressure checked every 3-5 years. If you are 51 years of age or older, have your blood pressure checked every year. You should have your blood pressure measured twice--once when you are at a hospital or clinic, and once when you are not at a hospital or clinic. Record the average of the two measurements. To check your blood pressure  when you are not at a hospital or clinic, you can use:  An automated blood pressure machine at a pharmacy.  A home blood pressure monitor.  If you are 41-60 years old, ask your health care provider if you should take aspirin to prevent heart disease.  Diabetes screening involves taking a blood sample to check your fasting blood sugar level. This should be done once every 3 years after age 28 if you are at a normal weight and without risk factors for diabetes. Testing should be considered at a younger age or be carried out more frequently if you are overweight and have at least 1 risk factor for diabetes.  Colorectal cancer can be detected and often prevented. Most routine colorectal cancer screening begins at the age of 23 and continues through age 30. However, your health care provider may recommend screening at an earlier age if you have risk factors for colon cancer. On a yearly basis, your health care provider may provide home test kits to check for hidden blood in the stool. A small camera at the end of a tube may be used to directly examine the colon (sigmoidoscopy or colonoscopy) to detect the earliest forms of colorectal cancer. Talk to your health care provider about this at age 62 when routine screening begins. A direct exam of the colon should be repeated every 5-10 years through age 61, unless early forms of precancerous polyps or small growths are found.  People who are at an increased risk for hepatitis B should be screened for this virus. You are considered at high risk for hepatitis B if:  You were born in a country where hepatitis B occurs often. Talk with your health care provider about which countries are considered high risk.  Your parents were born in a high-risk country and you have not received a shot to protect against hepatitis B (hepatitis B vaccine).  You have HIV or AIDS.  You use needles to inject street drugs.  You live with, or have sex with, someone who has  hepatitis B.  You are a man who has sex with other men (MSM).  You get hemodialysis treatment.  You take certain medicines for conditions like cancer, organ transplantation, and autoimmune conditions.  Hepatitis C blood testing is recommended for all people born from [birth year] through 1966 and any individual with known risk factors for hepatitis C.  Healthy men should no longer receive prostate-specific antigen (PSA) blood tests as part of routine cancer screening. Talk to your health care provider about prostate cancer screening.  Testicular cancer screening is not recommended for adolescents or adult males who have no symptoms. Screening includes self-exam, a health care provider exam, and other screening tests. Consult with your health care provider about any symptoms you have or any concerns you have about testicular cancer.  Practice safe sex. Use condoms and avoid high-risk sexual practices to reduce the  spread of sexually transmitted infections (STIs).  You should be screened for STIs, including gonorrhea and chlamydia if:  You are sexually active and are younger than 24 years.  You are older than 24 years, and your health care provider tells you that you are at risk for this type of infection.  Your sexual activity has changed since you were last screened, and you are at an increased risk for chlamydia or gonorrhea. Ask your health care provider if you are at risk.  If you are at risk of being infected with HIV, it is recommended that you take a prescription medicine daily to prevent HIV infection. This is called pre-exposure prophylaxis (PrEP). You are considered at risk if:  You are a man who has sex with other men (MSM).  You are a heterosexual man who is sexually active with multiple partners.  You take drugs by injection.  You are sexually active with a partner who has HIV.  Talk with your health care provider about whether you are at high risk of being infected with HIV. If  you choose to begin PrEP, you should first be tested for HIV. You should then be tested every 3 months for as long as you are taking PrEP.  Use sunscreen. Apply sunscreen liberally and repeatedly throughout the day. You should seek shade when your shadow is shorter than you. Protect yourself by wearing long sleeves, pants, a wide-brimmed hat, and sunglasses year round whenever you are outdoors.  Tell your health care provider of new moles or changes in moles, especially if there is a change in shape or color. Also, tell your health care provider if a mole is larger than the size of a pencil eraser.  A one-time screening for abdominal aortic aneurysm (AAA) and surgical repair of large AAAs by ultrasound is recommended for men aged 5-75 years who are current or former smokers.  Stay current with your vaccines (immunizations).   This information is not intended to replace advice given to you by your health care provider. Make sure you discuss any questions you have with your health care provider.   Document Released: 05/10/2008 Document Revised: 12/03/2014 Document Reviewed: 04/09/2011 Elsevier Interactive Patient Education [employer].

## 2016-08-20 NOTE — Progress Notes (Signed)
BP 136/82   Pulse 72   Temp 98.8 F (37.1 C) (Oral)   Ht 5' 10.25" (1.784 m)   Wt (!) 300 lb 4 oz (136.2 kg)   BMI 42.78 kg/m    CC: CPE Subjective:    Patient ID: [name], male    DOB: 06-30-1956, 60 y.o.   MRN: [id number]  HPI: MCCORMICK RACHLIN is a 60 y.o. male presenting on 08/20/2016 for Annual Exam   24 lb weight loss over the past year. Not really trying. Has changed eating habits - more fruits, prepared meals.   Preventative: COLONOSCOPY Date: 10/2013 4 diminutive polyps, rpt 3 yrs [name]).  Prostate screening - uncle with prostate cancer. Gets checked yearly.  Flu shot - at work  Tetanus 2012  zostavax - declines Seat belt use discussed Sunscreen use discussed. No changing moles on skin Ex smoker Alcohol - rare/social  Caffeine: 2-3 cups coffee/day  Lives alone, no pets, 1 child  Occupation: Proofreader, warehouse  Activity: stays active at work, no regular exercise  Diet: good water, daily fruits/vegetables   Relevant past medical, surgical, family and social history reviewed and updated as indicated. Interim medical history since our last visit reviewed. Allergies and medications reviewed and updated. Current Outpatient Prescriptions on File Prior to Visit  Medication Sig  . amLODipine (NORVASC) 10 MG tablet TAKE ONE DAILY  . Multiple Vitamin (MULTIVITAMIN) tablet Take 1 tablet by mouth daily.     No current facility-administered medications on file prior to visit.     Review of Systems  Constitutional: Negative for activity change, appetite change, chills, fatigue, fever and unexpected weight change.  HENT: Negative for hearing loss.   Eyes: Negative for visual disturbance.  Respiratory: Negative for cough, chest tightness, shortness of breath and wheezing.   Cardiovascular: Positive for leg swelling. Negative for chest pain and palpitations.  Gastrointestinal: Negative for abdominal distention, abdominal pain, blood in stool,  constipation, diarrhea, nausea and vomiting.  Genitourinary: Negative for difficulty urinating and hematuria.  Musculoskeletal: Negative for arthralgias, myalgias and neck pain.  Skin: Negative for rash.  Neurological: Negative for dizziness, seizures, syncope and headaches.  Hematological: Negative for adenopathy. Does not bruise/bleed easily.  Psychiatric/Behavioral: Negative for dysphoric mood. The patient is not nervous/anxious.    Per HPI unless specifically indicated in ROS section     Objective:    BP 136/82   Pulse 72   Temp 98.8 F (37.1 C) (Oral)   Ht 5' 10.25" (1.784 m)   Wt (!) 300 lb 4 oz (136.2 kg)   BMI 42.78 kg/m   Wt Readings from Last 3 Encounters:  08/20/16 (!) 300 lb 4 oz (136.2 kg)  08/17/15 (!) 324 lb (147 kg)  10/27/14 (!) 325 lb (147.4 kg)    Physical Exam  Constitutional: He is oriented to person, place, and time. He appears well-developed and well-nourished. No distress.  HENT:  Head: Normocephalic and atraumatic.  Right Ear: Hearing, tympanic membrane, external ear and ear canal normal.  Left Ear: Hearing, tympanic membrane, external ear and ear canal normal.  Nose: Nose normal.  Mouth/Throat: Uvula is midline, oropharynx is clear and moist and mucous membranes are normal. No oropharyngeal exudate, posterior oropharyngeal edema or posterior oropharyngeal erythema.  Eyes: Conjunctivae and EOM are normal. Pupils are equal, round, and reactive to light. No scleral icterus.  Neck: Normal range of motion. Neck supple. No thyromegaly present.  Cardiovascular: Normal rate, regular rhythm, normal heart sounds and intact distal pulses.  No murmur heard. Pulses:      Radial pulses are 2+ on the right side, and 2+ on the left side.  Pulmonary/Chest: Effort normal and breath sounds normal. No respiratory distress. He has no wheezes. He has no rales.  Abdominal: Soft. Bowel sounds are normal. He exhibits no distension and no mass. There is no tenderness. There  is no rebound and no guarding.  Genitourinary: Rectum normal and prostate normal. Rectal exam shows no external hemorrhoid, no internal hemorrhoid, no fissure, no mass, no tenderness and anal tone normal. Prostate is not enlarged (20gm) and not tender.  Musculoskeletal: Normal range of motion. He exhibits no edema.  Lymphadenopathy:    He has no cervical adenopathy.  Neurological: He is alert and oriented to person, place, and time.  CN grossly intact, station and gait intact  Skin: Skin is warm and dry. No rash noted.  Psychiatric: He has a normal mood and affect. His behavior is normal. Judgment and thought content normal.  Nursing note and vitals reviewed.  Results for orders placed or performed in visit on 08/14/16  Hepatitis C antibody  Result Value Ref Range   HCV Ab NEGATIVE NEGATIVE  Lipid panel  Result Value Ref Range   Cholesterol 159 0 - 200 mg/dL   Triglycerides 72.0 0.0 - 149.0 mg/dL   HDL 80.50 >39.00 mg/dL   VLDL 14.4 0.0 - 40.0 mg/dL   LDL Cholesterol 65 0 - 99 mg/dL   Total CHOL/HDL Ratio 2    NonHDL 78.98   Hemoglobin A1c  Result Value Ref Range   Hgb A1c MFr Bld 5.9 4.6 - 6.5 %  Basic metabolic panel  Result Value Ref Range   Sodium 141 135 - 145 mEq/L   Potassium 3.5 3.5 - 5.1 mEq/L   Chloride 104 96 - 112 mEq/L   CO2 25 19 - 32 mEq/L   Glucose, Bld 97 70 - 99 mg/dL   BUN 21 6 - 23 mg/dL   Creatinine, Ser 1.07 0.40 - 1.50 mg/dL   Calcium 9.0 8.4 - 10.5 mg/dL   GFR 90.16 >60.00 mL/min  PSA  Result Value Ref Range   PSA 0.70 0.10 - 4.00 ng/mL      Assessment & Plan:   Problem List Items Addressed This Visit    BPH (benign prostatic hypertrophy)    Exam and PSA stable.       ERECTILE DYSFUNCTION, ORGANIC    viagra and cialis ineffective. Offered uro referral. Pt opts to re trial viagra. Coupon provided. Discussed possible HA, monitor for priapism, avoid all nitrates      Essential hypertension    Chronic, stable. Continue current regimen.        Relevant Medications   atorvastatin (LIPITOR) 40 MG tablet   sildenafil (VIAGRA) 100 MG tablet   Healthcare maintenance - Primary    Preventative protocols reviewed and updated unless pt declined. Discussed healthy diet and lifestyle.       History of colonic polyps    Anticipate repeat colonoscopy at end of year      HLD (hyperlipidemia)    Wonderful control with HDL 80.  No personal or family history of CAD.  ASCVD 10 yr risk = 12% Will decrease lipitor to 40mg  daily. Pt agrees with plan.      Relevant Medications   atorvastatin (LIPITOR) 40 MG tablet   sildenafil (VIAGRA) 100 MG tablet   Prediabetes    Improved A1c likely due to weight loss noted.  Severe obesity (BMI >= 40) (HCC)    Congratulated on weight loss noted - pt attributes to healthier diet choices.        Other Visit Diagnoses   None.      Follow up plan: Return in about 1 year (around 08/20/2017), or as needed, for annual exam, prior fasting for blood work.  Ria Bush, MD

## 2016-08-20 NOTE — Assessment & Plan Note (Signed)
Anticipate repeat colonoscopy at end of year

## 2016-08-20 NOTE — Assessment & Plan Note (Signed)
Preventative protocols reviewed and updated unless pt declined. Discussed healthy diet and lifestyle.  

## 2016-08-20 NOTE — Assessment & Plan Note (Signed)
Improved A1c likely due to weight loss noted.

## 2016-08-20 NOTE — Assessment & Plan Note (Signed)
Congratulated on weight loss noted - pt attributes to healthier diet choices.

## 2016-08-20 NOTE — Assessment & Plan Note (Signed)
Exam and PSA stable.

## 2016-08-20 NOTE — Progress Notes (Signed)
Pre visit review using our clinic review tool, if applicable. No additional management support is needed unless otherwise documented below in the visit note. 

## 2016-08-20 NOTE — Assessment & Plan Note (Signed)
Wonderful control with HDL 80.  No personal or family history of CAD.  ASCVD 10 yr risk = 12% Will decrease lipitor to 40mg  daily. Pt agrees with plan.

## 2016-08-23 ENCOUNTER — Telehealth: Payer: Self-pay

## 2016-08-23 NOTE — Telephone Encounter (Signed)
Pt left v/m; pt trying to get viagra rx; pt was told by pharmacy that ins co needed more info from Dr Darnell Level. I spoke with Altha Harm at [place] and [place] was faxed to [facility] and Altha Harm will resend the PA. Pt request cb.

## 2016-08-24 NOTE — Telephone Encounter (Signed)
Will complete PA when received. Nothing so far.

## 2016-08-29 NOTE — Telephone Encounter (Signed)
PA completed, submitted and approved. Pt aware.

## 2016-09-28 ENCOUNTER — Other Ambulatory Visit: Payer: Self-pay | Admitting: Family Medicine

## 2016-11-06 ENCOUNTER — Encounter: Payer: Self-pay | Admitting: Internal Medicine

## 2017-05-08 ENCOUNTER — Encounter: Payer: Self-pay | Admitting: Internal Medicine

## 2017-06-26 HISTORY — PX: COLONOSCOPY: SHX174

## 2017-06-28 ENCOUNTER — Ambulatory Visit (AMBULATORY_SURGERY_CENTER): Payer: Self-pay

## 2017-06-28 VITALS — Ht 71.0 in | Wt 302.4 lb

## 2017-06-28 DIAGNOSIS — Z860100 Personal history of colon polyps, unspecified: Secondary | ICD-10-CM

## 2017-06-28 NOTE — Progress Notes (Signed)
No allergies to eggs or soy No diet meds No home oxygen No past problems with anesthesia  Declined emmi 

## 2017-07-01 ENCOUNTER — Encounter: Payer: Self-pay | Admitting: Internal Medicine

## 2017-07-12 ENCOUNTER — Ambulatory Visit (AMBULATORY_SURGERY_CENTER): Payer: Commercial Managed Care - PPO | Admitting: Internal Medicine

## 2017-07-12 ENCOUNTER — Encounter: Payer: Self-pay | Admitting: Internal Medicine

## 2017-07-12 VITALS — BP 135/82 | HR 54 | Temp 99.1°F | Resp 17 | Ht 71.0 in | Wt 302.0 lb

## 2017-07-12 DIAGNOSIS — D124 Benign neoplasm of descending colon: Secondary | ICD-10-CM

## 2017-07-12 DIAGNOSIS — D123 Benign neoplasm of transverse colon: Secondary | ICD-10-CM | POA: Diagnosis not present

## 2017-07-12 DIAGNOSIS — Z86010 Personal history of colonic polyps: Secondary | ICD-10-CM

## 2017-07-12 DIAGNOSIS — D128 Benign neoplasm of rectum: Secondary | ICD-10-CM

## 2017-07-12 DIAGNOSIS — D129 Benign neoplasm of anus and anal canal: Secondary | ICD-10-CM

## 2017-07-12 DIAGNOSIS — K621 Rectal polyp: Secondary | ICD-10-CM | POA: Diagnosis not present

## 2017-07-12 DIAGNOSIS — D122 Benign neoplasm of ascending colon: Secondary | ICD-10-CM

## 2017-07-12 MED ORDER — SODIUM CHLORIDE 0.9 % IV SOLN: 500.0000 mL | INTRAVENOUS | Status: DC

## 2017-07-12 NOTE — Progress Notes (Signed)
Pt's states no medical or surgical changes since previsit or office visit. 

## 2017-07-12 NOTE — Patient Instructions (Addendum)
   I found and removed 4 small polyps that look benign.  I will let you know pathology results and when to have another routine colonoscopy by mail and/or My Chart.  I appreciate the opportunity to care for you. Gatha Mayer, MD, [facility]  Polyp handout given to patient.  YOU HAD AN ENDOSCOPIC PROCEDURE TODAY AT [facility]:   Refer to the procedure report that was given to you for any specific questions about what was found during the examination.  If the procedure report does not answer your questions, please call your gastroenterologist to clarify.  If you requested that your care partner not be given the details of your procedure findings, then the procedure report has been included in a sealed envelope for you to review at your convenience later.  YOU SHOULD EXPECT: Some feelings of bloating in the abdomen. Passage of more gas than usual.  Walking can help get rid of the air that was put into your GI tract during the procedure and reduce the bloating. If you had a lower endoscopy (such as a colonoscopy or flexible sigmoidoscopy) you may notice spotting of blood in your stool or on the toilet paper. If you underwent a bowel prep for your procedure, you may not have a normal bowel movement for a few days.  Please Note:  You might notice some irritation and congestion in your nose or some drainage.  This is from the oxygen used during your procedure.  There is no need for concern and it should clear up in a day or so.  SYMPTOMS TO REPORT IMMEDIATELY:   Following lower endoscopy (colonoscopy or flexible sigmoidoscopy):  Excessive amounts of blood in the stool  Significant tenderness or worsening of abdominal pains  Swelling of the abdomen that is new, acute  Fever of 100F or higher For urgent or emergent issues, a gastroenterologist can be reached at any hour by calling [phone].   DIET:  We do recommend a small meal at first, but then you may proceed to your  regular diet.  Drink plenty of fluids but you should avoid alcoholic beverages for 24 hours.  ACTIVITY:  You should plan to take it easy for the rest of today and you should NOT DRIVE or use heavy machinery until tomorrow (because of the sedation medicines used during the test).    FOLLOW UP: Our staff will call the number listed on your records the next business day following your procedure to check on you and address any questions or concerns that you may have regarding the information given to you following your procedure. If we do not reach you, we will leave a message.  However, if you are feeling well and you are not experiencing any problems, there is no need to return our call.  We will assume that you have returned to your regular daily activities without incident.  If any biopsies were taken you will be contacted by phone or by letter within the next 1-3 weeks.  Please call us at [phone] if you have not heard about the biopsies in 3 weeks.    SIGNATURES/CONFIDENTIALITY: You and/or your care partner have signed paperwork which will be entered into your electronic medical record.  These signatures attest to the fact that that the information above on your After Visit Summary has been reviewed and is understood.  Full responsibility of the confidentiality of this discharge information lies with you and/or your care-partner.

## 2017-07-12 NOTE — Op Note (Signed)
[facility] Patient Name: Philip Middleton Procedure Date: 07/12/2017 2:10 PM MRN: [id number] Endoscopist: Gatha Mayer , MD Age: 61 Referring MD:  Date of Birth: May 07, 1956 Gender: Male Account #: [id number] Procedure:                Colonoscopy Indications:              Surveillance: Personal history of adenomatous                            polyps on last colonoscopy > 3 years ago Medicines:                Propofol per Anesthesia, Monitored Anesthesia Care Procedure:                Pre-Anesthesia Assessment:                           - Prior to the procedure, a History and Physical                            was performed, and patient medications and                            allergies were reviewed. The patient's tolerance of                            previous anesthesia was also reviewed. The risks                            and benefits of the procedure and the sedation                            options and risks were discussed with the patient.                            All questions were answered, and informed consent                            was obtained. Prior Anticoagulants: The patient has                            taken no previous anticoagulant or antiplatelet                            agents. ASA Grade Assessment: II - A patient with                            mild systemic disease. After reviewing the risks                            and benefits, the patient was deemed in                            satisfactory condition to undergo the procedure.  After obtaining informed consent, the colonoscope                            was passed under direct vision. Throughout the                            procedure, the patient's blood pressure, pulse, and                            oxygen saturations were monitored continuously. The                            Colonoscope was introduced through the anus and   advanced to the the cecum, identified by                            appendiceal orifice and ileocecal valve. The                            colonoscopy was performed without difficulty. The                            patient tolerated the procedure well. The quality                            of the bowel preparation was good. The ileocecal                            valve, appendiceal orifice, and rectum were                            photographed. The bowel preparation used was                            Miralax. Scope In: 2:14:49 PM Scope Out: 2:31:48 PM Scope Withdrawal Time: 0 hours 15 minutes 16 seconds  Total Procedure Duration: 0 hours 16 minutes 59 seconds  Findings:                 The perianal and digital rectal examinations were                            normal. Pertinent negatives include normal prostate                            (size, shape, and consistency).                           Four sessile polyps were found in the rectum,                            descending colon, transverse colon and ascending                            colon. The polyps were diminutive in size. These  polyps were removed with a cold snare. Resection                            and retrieval were complete. Verification of                            patient identification for the specimen was done.                            Estimated blood loss was minimal.                           The exam was otherwise without abnormality on                            direct and retroflexion views. Complications:            No immediate complications. Estimated Blood Loss:     Estimated blood loss was minimal. Impression:               - Four diminutive polyps in the rectum, in the                            descending colon, in the transverse colon and in                            the ascending colon, removed with a cold snare.                            Resected and retrieved.                            - The examination was otherwise normal on direct                            and retroflexion views.                           - Personal history of colonic polyps. 4 adenomas                            2014 Recommendation:           - Patient has a contact number available for                            emergencies. The signs and symptoms of potential                            delayed complications were discussed with the                            patient. Return to normal activities tomorrow.                            Written discharge instructions were provided  to the                            patient.                           - Resume previous diet.                           - Continue present medications.                           - Repeat colonoscopy is recommended for                            surveillance. The colonoscopy date will be                            determined after pathology results from today's                            exam become available for review. Gatha Mayer, MD 07/12/2017 2:38:48 PM This report has been signed electronically.

## 2017-07-12 NOTE — Progress Notes (Signed)
Called to room to assist during endoscopic procedure.  Patient ID and intended procedure confirmed with present staff. Received instructions for my participation in the procedure from the performing physician.  

## 2017-07-12 NOTE — Progress Notes (Signed)
To PACU VSS. Report to [email or web address] 

## 2017-07-15 ENCOUNTER — Telehealth: Payer: Self-pay

## 2017-07-15 NOTE — Telephone Encounter (Signed)
  Follow up Call-  Call back number 07/12/2017  Post procedure Call Back phone  # [phone]  Permission to leave phone message Yes  Some recent data might be hidden     Left message

## 2017-07-15 NOTE — Telephone Encounter (Signed)
  Follow up Call-  Call back number 07/12/2017  Post procedure Call Back phone  # [phone]  Permission to leave phone message Yes  Some recent data might be hidden     Left message

## 2017-07-22 ENCOUNTER — Encounter: Payer: Self-pay | Admitting: Family Medicine

## 2017-07-26 ENCOUNTER — Encounter: Payer: Self-pay | Admitting: Internal Medicine

## 2017-07-26 DIAGNOSIS — Z86010 Personal history of colonic polyps: Secondary | ICD-10-CM

## 2017-07-26 NOTE — Progress Notes (Signed)
3 diminutive adenomas - recall 2023

## 2017-08-11 ENCOUNTER — Other Ambulatory Visit: Payer: Self-pay | Admitting: Family Medicine

## 2017-08-11 DIAGNOSIS — R7303 Prediabetes: Secondary | ICD-10-CM

## 2017-08-11 DIAGNOSIS — E785 Hyperlipidemia, unspecified: Secondary | ICD-10-CM

## 2017-08-11 DIAGNOSIS — N400 Enlarged prostate without lower urinary tract symptoms: Secondary | ICD-10-CM

## 2017-08-15 ENCOUNTER — Other Ambulatory Visit (INDEPENDENT_AMBULATORY_CARE_PROVIDER_SITE_OTHER): Payer: Commercial Managed Care - PPO

## 2017-08-15 DIAGNOSIS — R7303 Prediabetes: Secondary | ICD-10-CM

## 2017-08-15 DIAGNOSIS — E785 Hyperlipidemia, unspecified: Secondary | ICD-10-CM | POA: Diagnosis not present

## 2017-08-15 DIAGNOSIS — N400 Enlarged prostate without lower urinary tract symptoms: Secondary | ICD-10-CM

## 2017-08-15 LAB — BASIC METABOLIC PANEL
BUN: 19 mg/dL (ref 6–23)
CALCIUM: 9.5 mg/dL (ref 8.4–10.5)
CO2: 32 mEq/L (ref 19–32)
Chloride: 104 mEq/L (ref 96–112)
Creatinine, Ser: 0.97 mg/dL (ref 0.40–1.50)
GFR: 100.64 mL/min (ref >60.00)
GLUCOSE: 103 mg/dL — AB (ref 70–99)
POTASSIUM: 4 meq/L (ref 3.5–5.1)
SODIUM: 142 meq/L (ref 135–145)

## 2017-08-15 LAB — LIPID PANEL
CHOLESTEROL: 186 mg/dL (ref 0–200)
HDL: 119.7 mg/dL (ref >39.00)
LDL CALC: 49 mg/dL (ref 0–99)
NONHDL: 66.72
Total CHOL/HDL Ratio: 2
Triglycerides: 90 mg/dL (ref 0.0–149.0)
VLDL: 18 mg/dL (ref 0.0–40.0)

## 2017-08-15 LAB — HEMOGLOBIN A1C: HEMOGLOBIN A1C: 5.7 % (ref 4.6–6.5)

## 2017-08-15 LAB — PSA: PSA: 0.97 ng/mL (ref 0.10–4.00)

## 2017-08-21 ENCOUNTER — Encounter: Payer: BLUE CROSS/BLUE SHIELD | Admitting: Family Medicine

## 2017-08-27 ENCOUNTER — Ambulatory Visit (INDEPENDENT_AMBULATORY_CARE_PROVIDER_SITE_OTHER): Payer: Commercial Managed Care - PPO | Admitting: Family Medicine

## 2017-08-27 ENCOUNTER — Encounter: Payer: Self-pay | Admitting: Family Medicine

## 2017-08-27 VITALS — BP 130/78 | HR 65 | Temp 98.5°F | Ht 69.5 in | Wt 303.5 lb

## 2017-08-27 DIAGNOSIS — N400 Enlarged prostate without lower urinary tract symptoms: Secondary | ICD-10-CM

## 2017-08-27 DIAGNOSIS — Z86711 Personal history of pulmonary embolism: Secondary | ICD-10-CM | POA: Diagnosis not present

## 2017-08-27 DIAGNOSIS — R7303 Prediabetes: Secondary | ICD-10-CM | POA: Diagnosis not present

## 2017-08-27 DIAGNOSIS — Z0000 Encounter for general adult medical examination without abnormal findings: Secondary | ICD-10-CM | POA: Diagnosis not present

## 2017-08-27 DIAGNOSIS — E785 Hyperlipidemia, unspecified: Secondary | ICD-10-CM | POA: Diagnosis not present

## 2017-08-27 DIAGNOSIS — I10 Essential (primary) hypertension: Secondary | ICD-10-CM | POA: Diagnosis not present

## 2017-08-27 MED ORDER — AMLODIPINE BESYLATE 10 MG PO TABS: 10.0000 mg | ORAL_TABLET | Freq: Every day | ORAL | 3 refills | Status: DC

## 2017-08-27 MED ORDER — ATORVASTATIN CALCIUM 40 MG PO TABS: 40.0000 mg | ORAL_TABLET | Freq: Every day | ORAL | 3 refills | Status: DC

## 2017-08-27 NOTE — Assessment & Plan Note (Signed)
Discussed healthy diet and lifestyle changes to affect sustainable weight loss  

## 2017-08-27 NOTE — Assessment & Plan Note (Signed)
Reviewed with patient

## 2017-08-27 NOTE — Assessment & Plan Note (Signed)
H/o bilateral DVT and PE 2009. He completed 6 mo coumadin therapy. Has been off blood thinner since then, no recurrent DVT or PE. He would likely benefit from aspirin 81mg  daily - advised he start this.

## 2017-08-27 NOTE — Assessment & Plan Note (Signed)
Preventative protocols reviewed and updated unless pt declined. Discussed healthy diet and lifestyle.  

## 2017-08-27 NOTE — Patient Instructions (Addendum)
Good to see you today. You are doing well today.  Add enteric coated aspirin 81mg  daily.  Return as needed or in 1 year for next physical.  Health Maintenance, Male A healthy lifestyle and preventive care is important for your health and wellness. Ask your health care provider about what schedule of regular examinations is right for you. What should I know about weight and diet? Eat a Healthy Diet  Eat plenty of vegetables, fruits, whole grains, low-fat dairy products, and lean protein.  Do not eat a lot of foods high in solid fats, added sugars, or salt.  Maintain a Healthy Weight Regular exercise can help you achieve or maintain a healthy weight. You should:  Do at least 150 minutes of exercise each week. The exercise should increase your heart rate and make you sweat (moderate-intensity exercise).  Do strength-training exercises at least twice a week.  Watch Your Levels of Cholesterol and Blood Lipids  Have your blood tested for lipids and cholesterol every 5 years starting at 62 years of age. If you are at high risk for heart disease, you should start having your blood tested when you are 62 years old. You may need to have your cholesterol levels checked more often if: ? Your lipid or cholesterol levels are high. ? You are older than 62 years of age. ? You are at high risk for heart disease.  What should I know about cancer screening? Many types of cancers can be detected early and may often be prevented. Lung Cancer  You should be screened every year for lung cancer if: ? You are a current smoker who has smoked for at least 30 years. ? You are a former smoker who has quit within the past 15 years.  Talk to your health care provider about your screening options, when you should start screening, and how often you should be screened.  Colorectal Cancer  Routine colorectal cancer screening usually begins at 62 years of age and should be repeated every 5-10 years until you are  62 years old. You may need to be screened more often if early forms of precancerous polyps or small growths are found. Your health care provider may recommend screening at an earlier age if you have risk factors for colon cancer.  Your health care provider may recommend using home test kits to check for hidden blood in the stool.  A small camera at the end of a tube can be used to examine your colon (sigmoidoscopy or colonoscopy). This checks for the earliest forms of colorectal cancer.  Prostate and Testicular Cancer  Depending on your age and overall health, your health care provider may do certain tests to screen for prostate and testicular cancer.  Talk to your health care provider about any symptoms or concerns you have about testicular or prostate cancer.  Skin Cancer  Check your skin from head to toe regularly.  Tell your health care provider about any new moles or changes in moles, especially if: ? There is a change in a mole's size, shape, or color. ? You have a mole that is larger than a pencil eraser.  Always use sunscreen. Apply sunscreen liberally and repeat throughout the day.  Protect yourself by wearing long sleeves, pants, a wide-brimmed hat, and sunglasses when outside.  What should I know about heart disease, diabetes, and high blood pressure?  If you are 57-8 years of age, have your blood pressure checked every 3-5 years. If you are 40 years of  age or older, have your blood pressure checked every year. You should have your blood pressure measured twice-once when you are at a hospital or clinic, and once when you are not at a hospital or clinic. Record the average of the two measurements. To check your blood pressure when you are not at a hospital or clinic, you can use: ? An automated blood pressure machine at a pharmacy. ? A home blood pressure monitor.  Talk to your health care provider about your target blood pressure.  If you are between 45-79 years old, ask  your health care provider if you should take aspirin to prevent heart disease.  Have regular diabetes screenings by checking your fasting blood sugar level. ? If you are at a normal weight and have a low risk for diabetes, have this test once every three years after the age of 45. ? If you are overweight and have a high risk for diabetes, consider being tested at a younger age or more often.  A one-time screening for abdominal aortic aneurysm (AAA) by ultrasound is recommended for men aged 65-75 years who are current or former smokers. What should I know about preventing infection? Hepatitis B If you have a higher risk for hepatitis B, you should be screened for this virus. Talk with your health care provider to find out if you are at risk for hepatitis B infection. Hepatitis C Blood testing is recommended for:  Everyone born from 1945 through 1965.  Anyone with known risk factors for hepatitis C.  Sexually Transmitted Diseases (STDs)  You should be screened each year for STDs including gonorrhea and chlamydia if: ? You are sexually active and are younger than 62 years of age. ? You are older than 62 years of age and your health care provider tells you that you are at risk for this type of infection. ? Your sexual activity has changed since you were last screened and you are at an increased risk for chlamydia or gonorrhea. Ask your health care provider if you are at risk.  Talk with your health care provider about whether you are at high risk of being infected with HIV. Your health care provider may recommend a prescription medicine to help prevent HIV infection.  What else can I do?  Schedule regular health, dental, and eye exams.  Stay current with your vaccines (immunizations).  Do not use any tobacco products, such as cigarettes, chewing tobacco, and e-cigarettes. If you need help quitting, ask your health care provider.  Limit alcohol intake to no more than 2 drinks per day.  One drink equals 12 ounces of beer, 5 ounces of wine, or 1 ounces of hard liquor.  Do not use street drugs.  Do not share needles.  Ask your health care provider for help if you need support or information about quitting drugs.  Tell your health care provider if you often feel depressed.  Tell your health care provider if you have ever been abused or do not feel safe at home. This information is not intended to replace advice given to you by your health care provider. Make sure you discuss any questions you have with your health care provider. Document Released: 05/10/2008 Document Revised: 07/11/2016 Document Reviewed: 08/16/2015 Elsevier Interactive Patient Education  2018 [employer]  

## 2017-08-27 NOTE — Progress Notes (Signed)
BP 130/78 (BP Location: Left Arm, Patient Position: Sitting, Cuff Size: Large)   Pulse 65   Temp 98.5 F (36.9 C) (Oral)   Ht 5' 9.5" (1.765 m)   Wt (!) 303 lb 8 oz (137.7 kg)   SpO2 97%   BMI 44.18 kg/m    CC: CPE Subjective:    Patient ID: Philip Middleton, male    DOB: 15-Oct-1956, 61 y.o.   MRN: [id number]  HPI: Philip Middleton is a 62 y.o. male presenting on 08/27/2017 for Annual Exam   H/o unprovoked symptomatic bilat PE 2009. Not on blood thinner.  He got into yellow jacket nest with several stings on arms and neck yesterday. Treated with ice packs, no residual pain or swelling.   Preventative: Colonoscopy 06/2017 3 TA, rpt 3 yrs [name]) Prostate screening - uncle with prostate cancer. Gets checked yearly.  Flu shot yearly at work Tetanus 2012  shingrix -discussed, declines  Seat belt use discussed Sunscreen use discussed. No changing moles on skin Ex smoker  Alcohol - rare/social  Caffeine: 2-3 cups coffee/day  Lives alone, no pets, 1 child  Occupation: Proofreader, warehouse  Activity: stays active at work, no regular exercise  Diet: good water, daily fruits/vegetables   Relevant past medical, surgical, family and social history reviewed and updated as indicated. Interim medical history since our last visit reviewed. Allergies and medications reviewed and updated. Outpatient Medications Prior to Visit  Medication Sig Dispense Refill  . Multiple Vitamin (MULTIVITAMIN) tablet Take 1 tablet by mouth daily.      . sildenafil (VIAGRA) 100 MG tablet Take 0.5-1 tablets (50-100 mg total) by mouth daily as needed for erectile dysfunction. 5 tablet 11  . amLODipine (NORVASC) 10 MG tablet TAKE ONE DAILY 90 tablet 3  . atorvastatin (LIPITOR) 40 MG tablet Take 1 tablet (40 mg total) by mouth daily at 6 PM. 90 tablet 1  . polyethylene glycol powder (MIRALAX) powder Take 1 Container by mouth once.     Facility-Administered Medications Prior to Visit  Medication Dose  Route Frequency Provider Last Rate Last Dose  . 0.9 %  sodium chloride infusion  500 mL Intravenous Continuous Gatha Mayer, MD         Per HPI unless specifically indicated in ROS section below Review of Systems  Constitutional: Negative for activity change, appetite change, chills, fatigue, fever and unexpected weight change.  HENT: Negative for hearing loss.   Eyes: Negative for visual disturbance.  Respiratory: Negative for cough, chest tightness, shortness of breath and wheezing.   Cardiovascular: Positive for leg swelling. Negative for chest pain and palpitations.  Gastrointestinal: Negative for abdominal distention, abdominal pain, blood in stool, constipation, diarrhea, nausea and vomiting.  Genitourinary: Negative for difficulty urinating and hematuria.  Musculoskeletal: Negative for arthralgias, myalgias and neck pain.  Skin: Negative for rash.  Neurological: Negative for dizziness, seizures, syncope and headaches.  Hematological: Negative for adenopathy. Does not bruise/bleed easily.  Psychiatric/Behavioral: Negative for dysphoric mood. The patient is not nervous/anxious.        Objective:    BP 130/78 (BP Location: Left Arm, Patient Position: Sitting, Cuff Size: Large)   Pulse 65   Temp 98.5 F (36.9 C) (Oral)   Ht 5' 9.5" (1.765 m)   Wt (!) 303 lb 8 oz (137.7 kg)   SpO2 97%   BMI 44.18 kg/m   Wt Readings from Last 3 Encounters:  08/27/17 (!) 303 lb 8 oz (137.7 kg)  07/12/17 (!) 302 lb (  137 kg)  06/28/17 (!) 302 lb 6.4 oz (137.2 kg)    Physical Exam  Constitutional: He is oriented to person, place, and time. He appears well-developed and well-nourished. No distress.  HENT:  Head: Normocephalic and atraumatic.  Right Ear: Hearing, tympanic membrane, external ear and ear canal normal.  Left Ear: Hearing, tympanic membrane, external ear and ear canal normal.  Nose: Nose normal.  Mouth/Throat: Uvula is midline, oropharynx is clear and moist and mucous membranes  are normal. No oropharyngeal exudate, posterior oropharyngeal edema or posterior oropharyngeal erythema.  Eyes: Pupils are equal, round, and reactive to light. Conjunctivae and EOM are normal. No scleral icterus.  Neck: Normal range of motion. Neck supple. No thyromegaly present.  Cardiovascular: Normal rate, regular rhythm, normal heart sounds and intact distal pulses.   No murmur heard. Pulses:      Radial pulses are 2+ on the right side, and 2+ on the left side.  Pulmonary/Chest: Effort normal and breath sounds normal. No respiratory distress. He has no wheezes. He has no rales.  Abdominal: Soft. Bowel sounds are normal. He exhibits no distension and no mass. There is no tenderness. There is no rebound and no guarding.  Genitourinary: Rectum normal and prostate normal. Rectal exam shows no external hemorrhoid, no internal hemorrhoid, no fissure, no mass, no tenderness and anal tone normal. Prostate is not enlarged (15gm) and not tender.  Musculoskeletal: Normal range of motion. He exhibits no edema.  Lymphadenopathy:    He has no cervical adenopathy.  Neurological: He is alert and oriented to person, place, and time.  CN grossly intact, station and gait intact  Skin: Skin is warm and dry. No rash noted.  Psychiatric: He has a normal mood and affect. His behavior is normal. Judgment and thought content normal.  Nursing note and vitals reviewed.  Results for orders placed or performed in visit on 08/15/17  Lipid panel  Result Value Ref Range   Cholesterol 186 0 - 200 mg/dL   Triglycerides 90.0 0.0 - 149.0 mg/dL   HDL 119.70 >39.00 mg/dL   VLDL 18.0 0.0 - 40.0 mg/dL   LDL Cholesterol 49 0 - 99 mg/dL   Total CHOL/HDL Ratio 2    NonHDL 65.68   Basic metabolic panel  Result Value Ref Range   Sodium 142 135 - 145 mEq/L   Potassium 4.0 3.5 - 5.1 mEq/L   Chloride 104 96 - 112 mEq/L   CO2 32 19 - 32 mEq/L   Glucose, Bld 103 (H) 70 - 99 mg/dL   BUN 19 6 - 23 mg/dL   Creatinine, Ser 0.97  0.40 - 1.50 mg/dL   Calcium 9.5 8.4 - 10.5 mg/dL   GFR 100.64 >60.00 mL/min  Hemoglobin A1c  Result Value Ref Range   Hgb A1c MFr Bld 5.7 4.6 - 6.5 %  PSA  Result Value Ref Range   PSA 0.97 0.10 - 4.00 ng/mL      Assessment & Plan:   Problem List Items Addressed This Visit    Benign prostatic hyperplasia    H/o this in the past. No signs of this on exam today.       Essential hypertension    Chronic, stable. Continue current regimen.       Relevant Medications   atorvastatin (LIPITOR) 40 MG tablet   amLODipine (NORVASC) 10 MG tablet   aspirin EC 81 MG tablet   Healthcare maintenance - Primary    Preventative protocols reviewed and updated unless pt declined. Discussed  healthy diet and lifestyle.       History of pulmonary embolism    H/o bilateral DVT and PE 2009. He completed 6 mo coumadin therapy. Has been off blood thinner since then, no recurrent DVT or PE. He would likely benefit from aspirin 81mg  daily - advised he start this.       HLD (hyperlipidemia)    Chronic, stable. Continue current regimen.  The ASCVD Risk score Mikey Bussing DC Jr., et al., 2013) failed to calculate for the following reasons:   The valid HDL cholesterol range is 20 to 100 mg/dL       Relevant Medications   atorvastatin (LIPITOR) 40 MG tablet   amLODipine (NORVASC) 10 MG tablet   aspirin EC 81 MG tablet   Prediabetes    Reviewed with patient.       Severe obesity (BMI >= 40) (HCC)    Discussed healthy diet and lifestyle changes to affect sustainable weight loss.           Follow up plan: Return in about 1 year (around 08/27/2018) for annual exam, prior fasting for blood work.  Ria Bush, MD

## 2017-08-27 NOTE — Assessment & Plan Note (Signed)
Chronic, stable. Continue current regimen.  The ASCVD Risk score Mikey Bussing DC Jr., et al., 2013) failed to calculate for the following reasons:   The valid HDL cholesterol range is 20 to 100 mg/dL

## 2017-08-27 NOTE — Assessment & Plan Note (Signed)
Chronic, stable. Continue current regimen. 

## 2017-08-27 NOTE — Assessment & Plan Note (Addendum)
H/o this in the past. No signs of this on exam today.

## 2017-10-01 ENCOUNTER — Other Ambulatory Visit: Payer: Self-pay | Admitting: Family Medicine

## 2017-12-16 ENCOUNTER — Encounter: Payer: Self-pay | Admitting: Family Medicine

## 2017-12-16 ENCOUNTER — Ambulatory Visit (INDEPENDENT_AMBULATORY_CARE_PROVIDER_SITE_OTHER): Payer: Commercial Managed Care - PPO | Admitting: Family Medicine

## 2017-12-16 VITALS — BP 140/78 | HR 59 | Temp 98.3°F | Wt 309.0 lb

## 2017-12-16 DIAGNOSIS — M1612 Unilateral primary osteoarthritis, left hip: Secondary | ICD-10-CM | POA: Insufficient documentation

## 2017-12-16 DIAGNOSIS — M25552 Pain in left hip: Secondary | ICD-10-CM | POA: Diagnosis not present

## 2017-12-16 DIAGNOSIS — R1032 Left lower quadrant pain: Secondary | ICD-10-CM | POA: Insufficient documentation

## 2017-12-16 DIAGNOSIS — N3001 Acute cystitis with hematuria: Secondary | ICD-10-CM | POA: Insufficient documentation

## 2017-12-16 DIAGNOSIS — N411 Chronic prostatitis: Secondary | ICD-10-CM | POA: Diagnosis not present

## 2017-12-16 LAB — POC URINALSYSI DIPSTICK (AUTOMATED)
GLUCOSE UA: NEGATIVE
Ketones, UA: NEGATIVE
Leukocytes, UA: NEGATIVE
Nitrite, UA: NEGATIVE
UROBILINOGEN UA: 0.2 U/dL
pH, UA: 6 (ref 5.0–8.0)

## 2017-12-16 MED ORDER — TAMSULOSIN HCL 0.4 MG PO CAPS: 0.4000 mg | ORAL_CAPSULE | Freq: Every day | ORAL | 0 refills | Status: DC

## 2017-12-16 MED ORDER — SULFAMETHOXAZOLE-TRIMETHOPRIM 800-160 MG PO TABS: 1.0000 | ORAL_TABLET | Freq: Two times a day (BID) | ORAL | 0 refills | Status: DC

## 2017-12-16 NOTE — Progress Notes (Signed)
BP 140/78 (BP Location: Left Arm, Patient Position: Sitting, Cuff Size: Large)   Pulse (!) 59   Temp 98.3 F (36.8 C) (Oral)   Wt (!) 309 lb (140.2 kg)   SpO2 98%   BMI 44.98 kg/m    CC: ?UTI Subjective:    Patient ID: Philip Middleton, male    DOB: 03-26-1956, 62 y.o.   MRN: [id number]  HPI: Philip Middleton is a 62 y.o. male presenting on 12/16/2017 for Flank Pain (On left side. Started about 1 mo ago); Urinary Frequency (Sometimes cannot get there fast enough.); and Abdominal Pain (mid lower abd pain/pressure)   1 month h/o L flank discomfort "muscle spasms" associated with urinary urgency, frequency, some dribbling, and L sided abdominal ache. Some lower back pain. Urine has strong smell to it. Noticing increasing dyspnea upon standing. No incomplete emptying or obstruction.   Denies dysuria, hematuria, fevers/chills, nausea/vomiting, diarrhea or constipation or blood in stool.  No prior h/o UTI. He did have prostate infection in the past - this may feel similar.  No prior h/o kidney stones.   Relevant past medical, surgical, family and social history reviewed and updated as indicated. Interim medical history since our last visit reviewed. Allergies and medications reviewed and updated. Outpatient Medications Prior to Visit  Medication Sig Dispense Refill  . amLODipine (NORVASC) 10 MG tablet Take 1 tablet (10 mg total) by mouth daily. 90 tablet 3  . aspirin EC 81 MG tablet Take 81 mg by mouth daily.    Marland Kitchen atorvastatin (LIPITOR) 40 MG tablet Take 1 tablet (40 mg total) by mouth daily at 6 PM. 90 tablet 3  . Multiple Vitamin (MULTIVITAMIN) tablet Take 1 tablet by mouth daily.      . Omega-3 Fatty Acids (FISH OIL) 1000 MG CAPS Take 1 capsule by mouth 2 (two) times daily.    . sildenafil (VIAGRA) 100 MG tablet Take 0.5-1 tablets (50-100 mg total) by mouth daily as needed for erectile dysfunction. 5 tablet 11  . Turmeric 500 MG CAPS Take 1 capsule by mouth daily.      Facility-Administered Medications Prior to Visit  Medication Dose Route Frequency Provider Last Rate Last Dose  . 0.9 %  sodium chloride infusion  500 mL Intravenous Continuous Gatha Mayer, MD         Per HPI unless specifically indicated in ROS section below Review of Systems     Objective:    BP 140/78 (BP Location: Left Arm, Patient Position: Sitting, Cuff Size: Large)   Pulse (!) 59   Temp 98.3 F (36.8 C) (Oral)   Wt (!) 309 lb (140.2 kg)   SpO2 98%   BMI 44.98 kg/m   Wt Readings from Last 3 Encounters:  12/16/17 (!) 309 lb (140.2 kg)  08/27/17 (!) 303 lb 8 oz (137.7 kg)  07/12/17 (!) 302 lb (137 kg)    Physical Exam  Constitutional: He is oriented to person, place, and time. He appears well-developed and well-nourished. No distress.  HENT:  Head: Normocephalic and atraumatic.  Mouth/Throat: Oropharynx is clear and moist. No oropharyngeal exudate.  Eyes: Conjunctivae are normal. Pupils are equal, round, and reactive to light.  Cardiovascular: Normal rate, regular rhythm, normal heart sounds and intact distal pulses.  No murmur heard. Pulmonary/Chest: Effort normal and breath sounds normal. No respiratory distress. He has no wheezes. He has no rales.  Abdominal: Soft. Normal appearance and bowel sounds are normal. He exhibits no distension and no mass. There is  no hepatosplenomegaly. There is tenderness (mild) in the left lower quadrant. There is no rebound, no guarding and no CVA tenderness. No hernia (not appreciated). Hernia confirmed negative in the right inguinal area and confirmed negative in the left inguinal area.  Musculoskeletal: He exhibits no edema.  No pain midline spine No paraspinous mm tenderness Neg SLR bilaterally. + pain with int/ext rotation at L>R hips. No pain at SIJ, GTB or sciatic notch bilaterally.  Antalgic gait  Neurological: He is alert and oriented to person, place, and time.  Skin: Skin is warm and dry. No rash noted.  Psychiatric:  He has a normal mood and affect.  Nursing note and vitals reviewed.  Results for orders placed or performed in visit on 12/16/17  POCT Urinalysis Dipstick (Automated)  Result Value Ref Range   Color, UA yellow    Clarity, UA clear    Glucose, UA negative    Bilirubin, UA 1+    Ketones, UA negative    Spec Grav, UA >=1.030 (A) 1.010 - 1.025   Blood, UA trace    pH, UA 6.0 5.0 - 8.0   Protein, UA trace    Urobilinogen, UA 0.2 0.2 or 1.0 E.U./dL   Nitrite, UA negative    Leukocytes, UA Negative Negative       Assessment & Plan:   Problem List Items Addressed This Visit    Acute cystitis with hematuria - Primary    UA/micro with some blood, WBC, crystals suggestive of kidney stone and possible UTI although doubt pyelo as no nausea/vomiting, fevers. In h/o chronic prostatitis, will treat with 2 wk course of bactrim. Will also start 10d flomax course. Ucx sent.  Will check KUB to eval for kidney stone.  Update with effect, update if worsening symptoms despite treatment. Pt agrees with plan.       Relevant Orders   Urine Culture   DG Abd 1 View   POCT Urinalysis Dipstick (Automated) (Completed)   Left hip pain    Worsening pain, exam suggestive of hip osteoarthritis - will check films to eval arthritic burden. Consider NSAID course pending results. Pt agrees.       Relevant Orders   DG HIP UNILAT WITH PELVIS 2-3 VIEWS LEFT   LLQ abdominal pain    Anticipate hip/prostate related symptoms. Doubt GI pathology given lack of change in bowel movements, blood in stool, fevers.       PROSTATITIS, CHRONIC    H/o this.      Severe obesity (BMI >= 40) (HCC)       Follow up plan: Return if symptoms worsen or fail to improve.  Ria Bush, MD

## 2017-12-16 NOTE — Assessment & Plan Note (Signed)
Worsening pain, exam suggestive of hip osteoarthritis - will check films to eval arthritic burden. Consider NSAID course pending results. Pt agrees.

## 2017-12-16 NOTE — Assessment & Plan Note (Addendum)
Anticipate hip/prostate related symptoms. Doubt GI pathology given lack of change in bowel movements, blood in stool, fevers.

## 2017-12-16 NOTE — Patient Instructions (Addendum)
I think you have L>R hip arthritis leading to some of your pain I also think you may have kidney stone and bladder infection - treat with bactrim twice daily for 2 weeks, start flomax for 10 days and increase water intake.  We will check urine culture. Update [place] with effect.

## 2017-12-16 NOTE — Assessment & Plan Note (Signed)
H/o this.  ?

## 2017-12-16 NOTE — Assessment & Plan Note (Addendum)
UA/micro with some blood, WBC, crystals suggestive of kidney stone and possible UTI although doubt pyelo as no nausea/vomiting, fevers. In h/o chronic prostatitis, will treat with 2 wk course of bactrim. Will also start 10d flomax course. Ucx sent.  Will check KUB to eval for kidney stone.  Update with effect, update if worsening symptoms despite treatment. Pt agrees with plan.

## 2017-12-17 ENCOUNTER — Ambulatory Visit
Admission: RE | Admit: 2017-12-17 | Discharge: 2017-12-17 | Disposition: A | Discharge status: Home / Self Care | Payer: Commercial Managed Care - PPO | Source: Ambulatory Visit | Attending: Family Medicine | Admitting: Family Medicine

## 2017-12-17 DIAGNOSIS — N3001 Acute cystitis with hematuria: Secondary | ICD-10-CM

## 2017-12-17 DIAGNOSIS — M25552 Pain in left hip: Secondary | ICD-10-CM

## 2017-12-17 LAB — URINE CULTURE
MICRO NUMBER: 90085616
RESULT: NO GROWTH
SPECIMEN QUALITY:: ADEQUATE

## 2017-12-21 ENCOUNTER — Other Ambulatory Visit: Payer: Self-pay | Admitting: Family Medicine

## 2017-12-21 MED ORDER — NAPROXEN 500 MG PO TABS: ORAL_TABLET | ORAL | 0 refills | Status: DC

## 2018-03-11 ENCOUNTER — Encounter: Payer: Self-pay | Admitting: Family Medicine

## 2018-03-11 ENCOUNTER — Ambulatory Visit (INDEPENDENT_AMBULATORY_CARE_PROVIDER_SITE_OTHER): Payer: Commercial Managed Care - PPO | Admitting: Family Medicine

## 2018-03-11 VITALS — BP 138/58 | HR 62 | Temp 98.3°F | Wt 315.8 lb

## 2018-03-11 DIAGNOSIS — N411 Chronic prostatitis: Secondary | ICD-10-CM | POA: Diagnosis not present

## 2018-03-11 DIAGNOSIS — M109 Gout, unspecified: Secondary | ICD-10-CM | POA: Insufficient documentation

## 2018-03-11 DIAGNOSIS — M1612 Unilateral primary osteoarthritis, left hip: Secondary | ICD-10-CM

## 2018-03-11 LAB — POC URINALSYSI DIPSTICK (AUTOMATED)
Blood, UA: NEGATIVE
Glucose, UA: NEGATIVE
LEUKOCYTES UA: NEGATIVE
NITRITE UA: NEGATIVE
PH UA: 6 (ref 5.0–8.0)
Spec Grav, UA: >=1.03 — AB (ref 1.010–1.025)
Urobilinogen, UA: 0.2 E.U./dL

## 2018-03-11 MED ORDER — NAPROXEN 500 MG PO TABS: ORAL_TABLET | ORAL | 0 refills | Status: DC

## 2018-03-11 MED ORDER — SULFAMETHOXAZOLE-TRIMETHOPRIM 800-160 MG PO TABS: 1.0000 | ORAL_TABLET | Freq: Two times a day (BID) | ORAL | 0 refills | Status: DC

## 2018-03-11 NOTE — Patient Instructions (Addendum)
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 Treat both with naprosyn course sent to pharmacy. We will refer you to orthopedist for further evaluation of hip.  Urinalysis today to further evaluate urinary urgency. Repeat bactrim 2 week course. Update [place] if symptoms return after treatment.   Gout Gout is painful swelling that can happen in some of your joints. Gout is a type of arthritis. This condition is caused by having too much uric acid in your body. Uric acid is a chemical that is made when your body breaks down substances called purines. If your body has too much uric acid, sharp crystals can form and build up in your joints. This causes pain and swelling. Gout attacks can happen quickly and be very painful (acute gout). Over time, the attacks can affect more joints and happen more often (chronic gout). Follow these instructions at home: During a Gout Attack  If directed, put ice on the painful area: ? Put ice in a plastic bag. ? Place a towel between your skin and the bag. ? Leave the ice on for 20 minutes, 2-3 times a day.  Rest the joint as much as possible. If the joint is in your leg, you may be given crutches to use.  Raise (elevate) the painful joint above the level of your heart as often as you can.  Drink enough fluids to keep your pee (urine) clear or pale yellow.  Take over-the-counter and prescription medicines only as told by your doctor.  Do not drive or use heavy machinery while taking prescription pain medicine.  Follow instructions from your doctor about what you can or cannot eat and drink.  Return to your normal activities as told by your doctor. Ask your doctor what activities are safe for you. Avoiding Future Gout Attacks  Follow a low-purine diet as told by a specialist (dietitian) or your doctor. Avoid foods and drinks that have a lot of purines, such  as: ? Liver. ? Kidney. ? Anchovies. ? Asparagus. ? Herring. ? Mushrooms ? Mussels. ? Beer.  Limit alcohol intake to no more than 1 drink a day for nonpregnant women and 2 drinks a day for men. One drink equals 12 oz of beer, 5 oz of wine, or 1 oz of hard liquor.  Stay at a healthy weight or lose weight if you are overweight. If you want to lose weight, talk with your doctor. It is important that you do not lose weight too fast.  Start or continue an exercise plan as told by your doctor.  Drink enough fluids to keep your pee clear or pale yellow.  Take over-the-counter and prescription medicines only as told by your doctor.  Keep all follow-up visits as told by your doctor. This is important. Contact a doctor if:  You have another gout attack.  You still have symptoms of a gout attack after10 days of treatment.  You have problems (side effects) because of your medicines.  You have chills or a fever.  You have burning pain when you pee (urinate).  You have pain in your lower back or belly. Get help right away if:  You have very bad pain.  Your pain cannot be controlled.  You cannot pee. This information is not intended to replace advice given to you by your health care provider. Make sure you discuss any questions you have with your health care provider. Document Released: 08/21/2008 Document Revised: 04/19/2016 Document Reviewed:  08/25/2015 Elsevier Interactive Patient Education  [employer].

## 2018-03-11 NOTE — Progress Notes (Signed)
BP (!) 138/58 (BP Location: Left Arm, Patient Position: Sitting, Cuff Size: Large)   Pulse 62   Temp 98.3 F (36.8 C) (Oral)   Wt (!) 315 lb 12 oz (143.2 kg)   SpO2 96%   BMI 45.96 kg/m    CC: recurrent L back and side pain Subjective:    Patient ID: Philip Middleton, male    DOB: 04-02-1956, 62 y.o.   MRN: [id number]  HPI: Philip Middleton is a 62 y.o. male presenting on 03/11/2018 for Back Pain (Recurring back pain. Medicine was helping but now it isn't.)   See prior note for details. Seen here 11/2017 with L sided hip and back pain - hip xrays showed worsening osteoarthritis. At that time he also had urinary symptoms. Treated with bactrim 2wk course, flomax 10 days and naprosyn 500mg  bid. This helped.   Over last few weeks noticing recurrent symptoms of L sided abd pain, L hip pain and some urinary symptoms again (frequency, urgency without dysuria or hematuria).   H/o chronic prostatitis.   Relevant past medical, surgical, family and social history reviewed and updated as indicated. Interim medical history since our last visit reviewed. Allergies and medications reviewed and updated. Outpatient Medications Prior to Visit  Medication Sig Dispense Refill  . amLODipine (NORVASC) 10 MG tablet Take 1 tablet (10 mg total) by mouth daily. 90 tablet 3  . aspirin EC 81 MG tablet Take 81 mg by mouth daily.    Marland Kitchen atorvastatin (LIPITOR) 40 MG tablet Take 1 tablet (40 mg total) by mouth daily at 6 PM. 90 tablet 3  . Multiple Vitamin (MULTIVITAMIN) tablet Take 1 tablet by mouth daily.      . Omega-3 Fatty Acids (FISH OIL) 1000 MG CAPS Take 1 capsule by mouth 2 (two) times daily.    . sildenafil (VIAGRA) 100 MG tablet Take 0.5-1 tablets (50-100 mg total) by mouth daily as needed for erectile dysfunction. 5 tablet 11  . Turmeric 500 MG CAPS Take 1 capsule by mouth daily.    . naproxen (NAPROSYN) 500 MG tablet Take one po bid x 1 week then prn pain, take with food 50 tablet 0  .  sulfamethoxazole-trimethoprim (BACTRIM DS,SEPTRA DS) 800-160 MG tablet Take 1 tablet by mouth 2 (two) times daily. 28 tablet 0  . tamsulosin (FLOMAX) 0.4 MG CAPS capsule Take 1 capsule (0.4 mg total) by mouth daily. 10 capsule 0   No facility-administered medications prior to visit.      Per HPI unless specifically indicated in ROS section below Review of Systems     Objective:    BP (!) 138/58 (BP Location: Left Arm, Patient Position: Sitting, Cuff Size: Large)   Pulse 62   Temp 98.3 F (36.8 C) (Oral)   Wt (!) 315 lb 12 oz (143.2 kg)   SpO2 96%   BMI 45.96 kg/m   Wt Readings from Last 3 Encounters:  03/11/18 (!) 315 lb 12 oz (143.2 kg)  12/16/17 (!) 309 lb (140.2 kg)  08/27/17 (!) 303 lb 8 oz (137.7 kg)    Physical Exam  Constitutional: He appears well-developed and well-nourished. No distress.  Genitourinary: Rectum normal and prostate normal. Rectal exam shows no external hemorrhoid, no internal hemorrhoid, no fissure, no mass, no tenderness and anal tone normal. Prostate is not enlarged (20gm) and not tender.  Musculoskeletal: Normal range of motion. He exhibits edema (tr).  R hip WNL L hip - tender at groin with int/ext rotation at hip L foot -  pain/swelling at 1st MTPJ with mild erythema and calor. No pain with axial loading Chronic bilateral pedal edema  Nursing note and vitals reviewed.  Results for orders placed or performed in visit on 03/11/18  POCT Urinalysis Dipstick (Automated)  Result Value Ref Range   Color, UA yellow    Clarity, UA clear    Glucose, UA negative    Bilirubin, UA 1+    Ketones, UA trace    Spec Grav, UA >=1.030 (A) 1.010 - 1.025   Blood, UA negative    pH, UA 6.0 5.0 - 8.0   Protein, UA trace    Urobilinogen, UA 0.2 0.2 or 1.0 E.U./dL   Nitrite, UA negative    Leukocytes, UA Negative Negative   Lab Results  Component Value Date   CREATININE 0.97 08/15/2017   DG HIP UNILAT WITH PELVIS 2-3 VIEWS LEFT CLINICAL DATA:  Chronic left  hip pain, no injury  EXAM: DG HIP (WITH OR WITHOUT PELVIS) 2-3V LEFT  COMPARISON:  CT abdomen pelvis of 10/29/2008  FINDINGS: There has been significant progression since the CT of 2009 in left hip degenerative joint disease. There is complete loss of joint space superiorly with spurring, sclerosis, and subchondral cyst formation. No fracture is seen.  IMPRESSION: Significant progression of degenerative joint disease of the left hip since the CT of 2009. No acute fracture.  Electronically Signed   By: Ivar Drape M.D.   On: 12/17/2017 11:09 DG Abd 1 View CLINICAL DATA:  Hematuria, left flank pain, history of kidney stones  EXAM: ABDOMEN - 1 VIEW  COMPARISON:  CT abdomen pelvis of 10/29/2008  FINDINGS: Unfortunately a, considerable bowel gas and feces overlies both renal shadows making assessment of calculi difficult. There may be faintly calcified left renal calculi noted but no definite right renal calculi are seen. No calcifications are noted along the expected courses of the ureters. There are considerable degenerative changes in the lumbar spine and within the left hip.  IMPRESSION: 1. Poor visualization of the kidneys due to overlying bowel gas and feces. Cannot exclude left renal calculi. 2. Significant degenerative change of the lumbar spine and left hip.  Electronically Signed   By: Ivar Drape M.D.   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 Rx naprosyn. Handout provided today. Update if not improving, consider colchicine. Check urate next labwork.      Relevant Medications   naproxen (NAPROSYN) 500 MG tablet   Primary osteoarthritis of left hip - Primary    Chronic, worsening. Start naprosyn course. Refer to ortho.       Relevant Medications   naproxen (NAPROSYN) 500 MG tablet   Other Relevant Orders    Ambulatory referral to Orthopedic Surgery   PROSTATITIS, CHRONIC    Concern for recurrent prostate infection with urinary symptoms endorsed. Check urinalysis today. Benign DRE today. Regardless, treat with another 2 wk bactrim course - which was previously effective (11/2017)      Relevant Orders   POCT Urinalysis Dipstick (Automated) (Completed)   Severe obesity (BMI >= 40) (HCC)       Meds ordered this encounter  Medications  . sulfamethoxazole-trimethoprim (BACTRIM DS,SEPTRA DS) 800-160 MG tablet    Sig: Take 1 tablet by mouth 2 (two) times daily.    Dispense:  28 tablet    Refill:  0  .  naproxen (NAPROSYN) 500 MG tablet    Sig: Take one po bid x 1 week then prn pain, take with food    Dispense:  50 tablet    Refill:  0   Orders Placed This Encounter  Procedures  . Ambulatory referral to Orthopedic Surgery    Referral Priority:   Routine    Referral Type:   Surgical    Referral Reason:   Specialty Services Required    Requested Specialty:   Orthopedic Surgery    Number of Visits Requested:   1  . POCT Urinalysis Dipstick (Automated)    Follow up plan: No follow-ups on file.  Ria Bush, MD

## 2018-03-11 NOTE — Assessment & Plan Note (Signed)
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 Rx naprosyn. Handout provided today. Update if not improving, consider colchicine. Check urate next labwork.

## 2018-03-11 NOTE — Assessment & Plan Note (Addendum)
Concern for recurrent prostate infection with urinary symptoms endorsed. Check urinalysis today. Benign DRE today. Regardless, treat with another 2 wk bactrim course - which was previously effective (11/2017)

## 2018-03-11 NOTE — Assessment & Plan Note (Signed)
Chronic, worsening. Start naprosyn course. Refer to ortho.

## 2018-03-12 ENCOUNTER — Telehealth: Payer: Self-pay

## 2018-03-12 NOTE — Telephone Encounter (Signed)
Left message for patient to call Azora Bonzo back in regards to a referral-Phylliss Strege V Dijon Cosens, RMA   

## 2018-03-17 ENCOUNTER — Telehealth: Payer: Self-pay

## 2018-03-17 NOTE — Telephone Encounter (Signed)
Left message for patient to call Anastasiya back in regards to a referral-Anastasiya V Hopkins, RMA   

## 2018-04-02 ENCOUNTER — Telehealth: Payer: Self-pay

## 2018-04-02 NOTE — Telephone Encounter (Signed)
Left message for patient to call Anastasiya back in regards to a referral-Anastasiya V Hopkins, RMA   

## 2018-08-12 ENCOUNTER — Encounter: Payer: Self-pay | Admitting: Family Medicine

## 2018-08-12 DIAGNOSIS — M1711 Unilateral primary osteoarthritis, right knee: Secondary | ICD-10-CM | POA: Insufficient documentation

## 2018-08-13 ENCOUNTER — Encounter: Payer: Commercial Managed Care - PPO | Admitting: Family Medicine

## 2018-08-20 ENCOUNTER — Other Ambulatory Visit: Payer: Self-pay | Admitting: Family Medicine

## 2018-09-06 ENCOUNTER — Other Ambulatory Visit: Payer: Self-pay | Admitting: Family Medicine

## 2018-09-06 DIAGNOSIS — E785 Hyperlipidemia, unspecified: Secondary | ICD-10-CM

## 2018-09-06 DIAGNOSIS — R7303 Prediabetes: Secondary | ICD-10-CM

## 2018-09-06 DIAGNOSIS — N400 Enlarged prostate without lower urinary tract symptoms: Secondary | ICD-10-CM

## 2018-09-06 DIAGNOSIS — M109 Gout, unspecified: Secondary | ICD-10-CM

## 2018-09-06 DIAGNOSIS — I10 Essential (primary) hypertension: Secondary | ICD-10-CM

## 2018-09-08 ENCOUNTER — Other Ambulatory Visit (INDEPENDENT_AMBULATORY_CARE_PROVIDER_SITE_OTHER): Payer: Commercial Managed Care - PPO

## 2018-09-08 DIAGNOSIS — M109 Gout, unspecified: Secondary | ICD-10-CM | POA: Diagnosis not present

## 2018-09-08 DIAGNOSIS — R7303 Prediabetes: Secondary | ICD-10-CM

## 2018-09-08 DIAGNOSIS — N400 Enlarged prostate without lower urinary tract symptoms: Secondary | ICD-10-CM

## 2018-09-08 DIAGNOSIS — I10 Essential (primary) hypertension: Secondary | ICD-10-CM | POA: Diagnosis not present

## 2018-09-08 DIAGNOSIS — E785 Hyperlipidemia, unspecified: Secondary | ICD-10-CM | POA: Diagnosis not present

## 2018-09-08 LAB — MICROALBUMIN / CREATININE URINE RATIO
CREATININE, U: 497.9 mg/dL
MICROALB UR: 4.6 mg/dL — AB (ref 0.0–1.9)
Microalb Creat Ratio: 0.9 mg/g (ref 0.0–30.0)

## 2018-09-08 LAB — COMPREHENSIVE METABOLIC PANEL
ALK PHOS: 74 U/L (ref 39–117)
ALT: 18 U/L (ref 0–53)
AST: 14 U/L (ref 0–37)
Albumin: 4.2 g/dL (ref 3.5–5.2)
BUN: 20 mg/dL (ref 6–23)
CO2: 32 mEq/L (ref 19–32)
CREATININE: 1.02 mg/dL (ref 0.40–1.50)
Calcium: 9.6 mg/dL (ref 8.4–10.5)
Chloride: 102 mEq/L (ref 96–112)
GFR: 94.64 mL/min (ref >60.00)
GLUCOSE: 113 mg/dL — AB (ref 70–99)
POTASSIUM: 3.8 meq/L (ref 3.5–5.1)
Sodium: 140 mEq/L (ref 135–145)
TOTAL PROTEIN: 7 g/dL (ref 6.0–8.3)
Total Bilirubin: 0.6 mg/dL (ref 0.2–1.2)

## 2018-09-08 LAB — LIPID PANEL
Cholesterol: 169 mg/dL (ref 0–200)
HDL: 77.1 mg/dL (ref >39.00)
LDL CALC: 67 mg/dL (ref 0–99)
NONHDL: 92.13
Total CHOL/HDL Ratio: 2
Triglycerides: 127 mg/dL (ref 0.0–149.0)
VLDL: 25.4 mg/dL (ref 0.0–40.0)

## 2018-09-08 LAB — PSA: PSA: 0.77 ng/mL (ref 0.10–4.00)

## 2018-09-08 LAB — HEMOGLOBIN A1C: Hgb A1c MFr Bld: 6 % (ref 4.6–6.5)

## 2018-09-08 LAB — URIC ACID: URIC ACID, SERUM: 7.4 mg/dL (ref 4.0–7.8)

## 2018-09-11 NOTE — Assessment & Plan Note (Signed)
Preventative protocols reviewed and updated unless pt declined. Discussed healthy diet and lifestyle.  

## 2018-09-11 NOTE — Progress Notes (Signed)
BP 136/78 (BP Location: Left Arm, Patient Position: Sitting, Cuff Size: Large)   Pulse 84   Temp 98.6 F (37 C) (Oral)   Ht 5' 9.5" (1.765 m)   Wt (!) 319 lb 12 oz (145 kg)   SpO2 95%   BMI 46.54 kg/m    CC: CPE Subjective:    Patient ID: Philip Middleton, male    DOB: 1956/06/10, 62 y.o.   MRN: [id number]  HPI: Philip Middleton is a 62 y.o. male presenting on 09/12/2018 for Annual Exam   Saw ortho - found out he needs L hip and R knee replacement. Has had steroid injections in knee and hip. Stopped meloxicam due to flank discomfort.   Obesity -  24 hour recall: Mainly drinks water during the day Breakfast - banana, cup of coffee Lunch - can of vienna mini sausages and pack of nabs Dinner - sister cooks - pinto beans, smoked [place], corn, water Sedentary job - driving forklift.   Some R arm numbness comes and goes. No inciting trauma. No neck pain. No weakness.   Nocturia 6-8 times at night.   Preventative: Colonoscopy 06/2017 3 TA, rpt 3 yrs Philip Middleton) Prostate screening - uncle with prostate cancer. Gets checked yearly. DRE reassuring 02/2018.  Flu shot yearly at work  Td 2012  shingrix -discussed, declines  Seat belt use discussed Sunscreen use discussed. No changing moles on skin Ex smoker  Alcohol - rare/social Dentist Q6 mo Eye exam yearly  Caffeine: 2-3 cups coffee/day  Lives alone, no pets, 1 child  Occupation: Proofreader, warehouse  Activity: no regular exercise  Diet: good water, daily fruits/vegetables   Relevant past medical, surgical, family and social history reviewed and updated as indicated. Interim medical history since our last visit reviewed. Allergies and medications reviewed and updated. Outpatient Medications Prior to Visit  Medication Sig Dispense Refill  . amLODipine (NORVASC) 10 MG tablet TAKE 1 TABLET BY MOUTH EVERY DAY 90 tablet 0  . aspirin EC 81 MG tablet Take 81 mg by mouth daily.    Marland Kitchen atorvastatin (LIPITOR) 40 MG tablet TAKE  1 TABLET (40 MG TOTAL) BY MOUTH DAILY AT 6 PM. 90 tablet 0  . Multiple Vitamin (MULTIVITAMIN) tablet Take 1 tablet by mouth daily.      . Omega-3 Fatty Acids (FISH OIL) 1000 MG CAPS Take 1 capsule by mouth 2 (two) times daily.    . sildenafil (VIAGRA) 100 MG tablet Take 0.5-1 tablets (50-100 mg total) by mouth daily as needed for erectile dysfunction. 5 tablet 11  . sulfamethoxazole-trimethoprim (BACTRIM DS,SEPTRA DS) 800-160 MG tablet Take 1 tablet by mouth 2 (two) times daily. 28 tablet 0  . Turmeric 500 MG CAPS Take 1 capsule by mouth daily.    . naproxen (NAPROSYN) 500 MG tablet Take one po bid x 1 week then prn pain, take with food 50 tablet 0   No facility-administered medications prior to visit.      Per HPI unless specifically indicated in ROS section below Review of Systems  Constitutional: Negative for activity change, appetite change, chills, fatigue, fever and unexpected weight change.  HENT: Negative for hearing loss.   Eyes: Negative for visual disturbance.  Respiratory: Positive for cough and shortness of breath. Negative for chest tightness and wheezing.   Cardiovascular: Positive for leg swelling. Negative for chest pain and palpitations.  Gastrointestinal: Negative for abdominal distention, abdominal pain, blood in stool, constipation, diarrhea, nausea and vomiting.  Genitourinary: Negative for difficulty urinating and  hematuria.  Musculoskeletal: Negative for arthralgias, myalgias and neck pain.  Skin: Negative for rash.  Neurological: Negative for dizziness, seizures, syncope and headaches.  Hematological: Negative for adenopathy. Does not bruise/bleed easily.  Psychiatric/Behavioral: Negative for dysphoric mood. The patient is not nervous/anxious.        Objective:    BP 136/78 (BP Location: Left Arm, Patient Position: Sitting, Cuff Size: Large)   Pulse 84   Temp 98.6 F (37 C) (Oral)   Ht 5' 9.5" (1.765 m)   Wt (!) 319 lb 12 oz (145 kg)   SpO2 95%   BMI  46.54 kg/m   Wt Readings from Last 3 Encounters:  09/12/18 (!) 319 lb 12 oz (145 kg)  03/11/18 (!) 315 lb 12 oz (143.2 kg)  12/16/17 (!) 309 lb (140.2 kg)    Physical Exam  Constitutional: He is oriented to person, place, and time. He appears well-developed and well-nourished. No distress.  HENT:  Head: Normocephalic and atraumatic.  Right Ear: Hearing, tympanic membrane, external ear and ear canal normal.  Left Ear: Hearing, tympanic membrane, external ear and ear canal normal.  Nose: Nose normal.  Mouth/Throat: Uvula is midline, oropharynx is clear and moist and mucous membranes are normal. No oropharyngeal exudate, posterior oropharyngeal edema or posterior oropharyngeal erythema.  Eyes: Pupils are equal, round, and reactive to light. Conjunctivae and EOM are normal. No scleral icterus.  Neck: Normal range of motion. Neck supple.  Cardiovascular: Normal rate, regular rhythm, normal heart sounds and intact distal pulses.  No murmur heard. Pulses:      Radial pulses are 2+ on the right side, and 2+ on the left side.  Pulmonary/Chest: Effort normal and breath sounds normal. No respiratory distress. He has no wheezes. He has no rales.  Abdominal: Soft. Bowel sounds are normal. He exhibits no distension and no mass. There is no tenderness. There is no rebound and no guarding.  Musculoskeletal: Normal range of motion. He exhibits edema (nonpitting).  Lymphadenopathy:    He has no cervical adenopathy.  Neurological: He is alert and oriented to person, place, and time.  CN grossly intact, station and gait intact  Skin: Skin is warm and dry. No rash noted.  Psychiatric: He has a normal mood and affect. His behavior is normal. Judgment and thought content normal.  Nursing note and vitals reviewed.  Results for orders placed or performed in visit on 09/08/18  PSA  Result Value Ref Range   PSA 0.77 0.10 - 4.00 ng/mL  Microalbumin / creatinine urine ratio  Result Value Ref Range    Microalb, Ur 4.6 (H) 0.0 - 1.9 mg/dL   Creatinine,U 497.9 mg/dL   Microalb Creat Ratio 0.9 0.0 - 30.0 mg/g  Uric acid  Result Value Ref Range   Uric Acid, Serum 7.4 4.0 - 7.8 mg/dL  Hemoglobin A1c  Result Value Ref Range   Hgb A1c MFr Bld 6.0 4.6 - 6.5 %  Comprehensive metabolic panel  Result Value Ref Range   Sodium 140 135 - 145 mEq/L   Potassium 3.8 3.5 - 5.1 mEq/L   Chloride 102 96 - 112 mEq/L   CO2 32 19 - 32 mEq/L   Glucose, Bld 113 (H) 70 - 99 mg/dL   BUN 20 6 - 23 mg/dL   Creatinine, Ser 1.02 0.40 - 1.50 mg/dL   Total Bilirubin 0.6 0.2 - 1.2 mg/dL   Alkaline Phosphatase 74 39 - 117 U/L   AST 14 0 - 37 U/L   ALT 18  0 - 53 U/L   Total Protein 7.0 6.0 - 8.3 g/dL   Albumin 4.2 3.5 - 5.2 g/dL   Calcium 9.6 8.4 - 10.5 mg/dL   GFR 94.64 >60.00 mL/min  Lipid panel  Result Value Ref Range   Cholesterol 169 0 - 200 mg/dL   Triglycerides 127.0 0.0 - 149.0 mg/dL   HDL 77.10 >39.00 mg/dL   VLDL 25.4 0.0 - 40.0 mg/dL   LDL Cholesterol 67 0 - 99 mg/dL   Total CHOL/HDL Ratio 2    NonHDL 92.13       Assessment & Plan:  Pt declines scheduled f/u - will leave open ended.  Problem List Items Addressed This Visit    Severe obesity (BMI >= 40) (HCC)    Encouraged healthy diet changes to affect sustainable weight loss. Activity limited by arthritis pain. Recommended nutritionist referral - pt declines. Discussed bariatric medication - he will decline for now.       Primary osteoarthritis of right knee   Relevant Medications   naproxen sodium (ALEVE) 220 MG tablet   Primary osteoarthritis of left hip   Relevant Medications   naproxen sodium (ALEVE) 220 MG tablet   Prediabetes    Reviewed with patient - encouraged avoiding added sugars.       HLD (hyperlipidemia)    Chronic, stable. Continue current regimen. The 10-year ASCVD risk score Mikey Bussing [name]., et al., 2013) is: 14.1%   Values used to calculate the score:     Age: 35 years     Sex: Male     Is Non-Hispanic African  American: Yes     Diabetic: No     Tobacco smoker: No     Systolic Blood Pressure: 502 mmHg     Is BP treated: Yes     HDL Cholesterol: 77.1 mg/dL     Total Cholesterol: 169 mg/dL       History of pulmonary embolism    Continue aspirin.       Healthcare maintenance - Primary    Preventative protocols reviewed and updated unless pt declined. Discussed healthy diet and lifestyle.       Essential hypertension    Chronic, stable. Continue current regimen.       Benign prostatic hyperplasia    With nocturia. DRE reassuring. Check UA and if normal consider flomax.       Arm paresthesia, right    Entire R arm paresthesia without neck symptoms - start B12 oral supplement.           Meds ordered this encounter  Medications  . naproxen sodium (ALEVE) 220 MG tablet    Sig: Take 2 tablets (440 mg total) by mouth daily as needed.  . Cyanocobalamin (B-12) 1000 MCG SUBL    Sig: Place 1 tablet under the tongue daily.    Dispense:  30 each   No orders of the defined types were placed in this encounter.   Follow up plan: Return in about 1 year (around 09/13/2019) for annual exam, prior fasting for blood work.  Ria Bush, MD

## 2018-09-12 ENCOUNTER — Encounter (INDEPENDENT_AMBULATORY_CARE_PROVIDER_SITE_OTHER): Payer: Self-pay

## 2018-09-12 ENCOUNTER — Ambulatory Visit (INDEPENDENT_AMBULATORY_CARE_PROVIDER_SITE_OTHER): Payer: Commercial Managed Care - PPO | Admitting: Family Medicine

## 2018-09-12 ENCOUNTER — Encounter: Payer: Self-pay | Admitting: Family Medicine

## 2018-09-12 VITALS — BP 136/78 | HR 84 | Temp 98.6°F | Ht 69.5 in | Wt 319.8 lb

## 2018-09-12 DIAGNOSIS — N401 Enlarged prostate with lower urinary tract symptoms: Secondary | ICD-10-CM | POA: Diagnosis not present

## 2018-09-12 DIAGNOSIS — Z0000 Encounter for general adult medical examination without abnormal findings: Secondary | ICD-10-CM | POA: Diagnosis not present

## 2018-09-12 DIAGNOSIS — R202 Paresthesia of skin: Secondary | ICD-10-CM

## 2018-09-12 DIAGNOSIS — M1612 Unilateral primary osteoarthritis, left hip: Secondary | ICD-10-CM | POA: Diagnosis not present

## 2018-09-12 DIAGNOSIS — M1711 Unilateral primary osteoarthritis, right knee: Secondary | ICD-10-CM

## 2018-09-12 DIAGNOSIS — E785 Hyperlipidemia, unspecified: Secondary | ICD-10-CM

## 2018-09-12 DIAGNOSIS — R351 Nocturia: Secondary | ICD-10-CM

## 2018-09-12 DIAGNOSIS — Z86711 Personal history of pulmonary embolism: Secondary | ICD-10-CM

## 2018-09-12 DIAGNOSIS — R7303 Prediabetes: Secondary | ICD-10-CM

## 2018-09-12 DIAGNOSIS — I10 Essential (primary) hypertension: Secondary | ICD-10-CM

## 2018-09-12 LAB — POC URINALSYSI DIPSTICK (AUTOMATED)
Blood, UA: NEGATIVE
Glucose, UA: NEGATIVE
LEUKOCYTES UA: NEGATIVE
NITRITE UA: NEGATIVE
PROTEIN UA: POSITIVE — AB
Spec Grav, UA: >=1.03 — AB (ref 1.010–1.025)
UROBILINOGEN UA: 1 U/dL
pH, UA: 6 (ref 5.0–8.0)

## 2018-09-12 MED ORDER — NAPROXEN SODIUM 220 MG PO TABS: 440.0000 mg | ORAL_TABLET | Freq: Every day | ORAL | Status: DC | PRN

## 2018-09-12 MED ORDER — B-12 1000 MCG SL SUBL: 1.0000 | SUBLINGUAL_TABLET | Freq: Every day | SUBLINGUAL | Status: DC

## 2018-09-12 NOTE — Assessment & Plan Note (Signed)
Chronic, stable. Continue current regimen. The 10-year ASCVD risk score Mikey Bussing [name]., et al., 2013) is: 14.1%   Values used to calculate the score:     Age: 62 years     Sex: Male     Is Non-Hispanic African American: Yes     Diabetic: No     Tobacco smoker: No     Systolic Blood Pressure: 125 mmHg     Is BP treated: Yes     HDL Cholesterol: 77.1 mg/dL     Total Cholesterol: 169 mg/dL

## 2018-09-12 NOTE — Assessment & Plan Note (Signed)
With nocturia. DRE reassuring. Check UA and if normal consider flomax.

## 2018-09-12 NOTE — Assessment & Plan Note (Signed)
Continue aspirin 

## 2018-09-12 NOTE — Assessment & Plan Note (Signed)
Chronic, stable. Continue current regimen. 

## 2018-09-12 NOTE — Assessment & Plan Note (Signed)
Reviewed with patient - encouraged avoiding added sugars.

## 2018-09-12 NOTE — Addendum Note (Signed)
Addended by: Brenton Grills on: [id number] 02:59 PM   Modules accepted: Orders

## 2018-09-12 NOTE — Patient Instructions (Addendum)
Let me know if you'd like to see nutritionist.  Good to see you today, call us with questions. Start dissolvable vitamin b12 over the counter to take daily.  Urinalysis today.   Health Maintenance, Male A healthy lifestyle and preventive care is important for your health and wellness. Ask your health care provider about what schedule of regular examinations is right for you. What should I know about weight and diet? Eat a Healthy Diet  Eat plenty of vegetables, fruits, whole grains, low-fat dairy products, and lean protein.  Do not eat a lot of foods high in solid fats, added sugars, or salt.  Maintain a Healthy Weight Regular exercise can help you achieve or maintain a healthy weight. You should:  Do at least 150 minutes of exercise each week. The exercise should increase your heart rate and make you sweat (moderate-intensity exercise).  Do strength-training exercises at least twice a week.  Watch Your Levels of Cholesterol and Blood Lipids  Have your blood tested for lipids and cholesterol every 5 years starting at 63 years of age. If you are at high risk for heart disease, you should start having your blood tested when you are 63 years old. You may need to have your cholesterol levels checked more often if: ? Your lipid or cholesterol levels are high. ? You are older than 63 years of age. ? You are at high risk for heart disease.  What should I know about cancer screening? Many types of cancers can be detected early and may often be prevented. Lung Cancer  You should be screened every year for lung cancer if: ? You are a current smoker who has smoked for at least 30 years. ? You are a former smoker who has quit within the past 15 years.  Talk to your health care provider about your screening options, when you should start screening, and how often you should be screened.  Colorectal Cancer  Routine colorectal cancer screening usually begins at 63 years of age and should be  repeated every 5-10 years until you are 63 years old. You may need to be screened more often if early forms of precancerous polyps or small growths are found. Your health care provider may recommend screening at an earlier age if you have risk factors for colon cancer.  Your health care provider may recommend using home test kits to check for hidden blood in the stool.  A small camera at the end of a tube can be used to examine your colon (sigmoidoscopy or colonoscopy). This checks for the earliest forms of colorectal cancer.  Prostate and Testicular Cancer  Depending on your age and overall health, your health care provider may do certain tests to screen for prostate and testicular cancer.  Talk to your health care provider about any symptoms or concerns you have about testicular or prostate cancer.  Skin Cancer  Check your skin from head to toe regularly.  Tell your health care provider about any new moles or changes in moles, especially if: ? There is a change in a mole's size, shape, or color. ? You have a mole that is larger than a pencil eraser.  Always use sunscreen. Apply sunscreen liberally and repeat throughout the day.  Protect yourself by wearing long sleeves, pants, a wide-brimmed hat, and sunglasses when outside.  What should I know about heart disease, diabetes, and high blood pressure?  If you are 80-35 years of age, have your blood pressure checked every 3-5 years. If  you are 30 years of age or older, have your blood pressure checked every year. You should have your blood pressure measured twice-once when you are at a hospital or clinic, and once when you are not at a hospital or clinic. Record the average of the two measurements. To check your blood pressure when you are not at a hospital or clinic, you can use: ? An automated blood pressure machine at a pharmacy. ? A home blood pressure monitor.  Talk to your health care provider about your target blood  pressure.  If you are between 56-78 years old, ask your health care provider if you should take aspirin to prevent heart disease.  Have regular diabetes screenings by checking your fasting blood sugar level. ? If you are at a normal weight and have a low risk for diabetes, have this test once every three years after the age of 56. ? If you are overweight and have a high risk for diabetes, consider being tested at a younger age or more often.  A one-time screening for abdominal aortic aneurysm (AAA) by ultrasound is recommended for men aged 65-75 years who are current or former smokers. What should I know about preventing infection? Hepatitis B If you have a higher risk for hepatitis B, you should be screened for this virus. Talk with your health care provider to find out if you are at risk for hepatitis B infection. Hepatitis C Blood testing is recommended for:  Everyone born from [birth year] through 1966.  Anyone with known risk factors for hepatitis C.  Sexually Transmitted Diseases (STDs)  You should be screened each year for STDs including gonorrhea and chlamydia if: ? You are sexually active and are younger than 63 years of age. ? You are older than 63 years of age and your health care provider tells you that you are at risk for this type of infection. ? Your sexual activity has changed since you were last screened and you are at an increased risk for chlamydia or gonorrhea. Ask your health care provider if you are at risk.  Talk with your health care provider about whether you are at high risk of being infected with HIV. Your health care provider may recommend a prescription medicine to help prevent HIV infection.  What else can I do?  Schedule regular health, dental, and eye exams.  Stay current with your vaccines (immunizations).  Do not use any tobacco products, such as cigarettes, chewing tobacco, and e-cigarettes. If you need help quitting, ask your health care  provider.  Limit alcohol intake to no more than 2 drinks per day. One drink equals 12 ounces of beer, 5 ounces of wine, or 1 ounces of hard liquor.  Do not use street drugs.  Do not share needles.  Ask your health care provider for help if you need support or information about quitting drugs.  Tell your health care provider if you often feel depressed.  Tell your health care provider if you have ever been abused or do not feel safe at home. This information is not intended to replace advice given to you by your health care provider. Make sure you discuss any questions you have with your health care provider. Document Released: 05/10/2008 Document Revised: 07/11/2016 Document Reviewed: 08/16/2015 Elsevier Interactive Patient Education  [employer].

## 2018-09-12 NOTE — Assessment & Plan Note (Signed)
Encouraged healthy diet changes to affect sustainable weight loss. Activity limited by arthritis pain. Recommended nutritionist referral - pt declines. Discussed bariatric medication - he will decline for now.

## 2018-09-12 NOTE — Assessment & Plan Note (Signed)
Entire R arm paresthesia without neck symptoms - start B12 oral supplement.

## 2018-11-17 ENCOUNTER — Other Ambulatory Visit: Payer: Self-pay | Admitting: Family Medicine

## 2019-01-21 DIAGNOSIS — M1612 Unilateral primary osteoarthritis, left hip: Secondary | ICD-10-CM | POA: Diagnosis not present

## 2019-02-04 DIAGNOSIS — M1612 Unilateral primary osteoarthritis, left hip: Secondary | ICD-10-CM | POA: Diagnosis not present

## 2019-02-04 DIAGNOSIS — M1711 Unilateral primary osteoarthritis, right knee: Secondary | ICD-10-CM | POA: Diagnosis not present

## 2019-02-08 ENCOUNTER — Other Ambulatory Visit: Payer: Self-pay | Admitting: Family Medicine

## 2019-11-14 ENCOUNTER — Other Ambulatory Visit: Payer: Self-pay | Admitting: Family Medicine

## 2019-12-17 ENCOUNTER — Other Ambulatory Visit: Payer: Self-pay | Admitting: Family Medicine

## 2020-01-22 ENCOUNTER — Other Ambulatory Visit: Payer: Self-pay | Admitting: Family Medicine

## 2020-01-22 NOTE — Telephone Encounter (Signed)
Plz schedule CPE and lab visits.  Then send encounter back to me to send refill.

## 2020-01-22 NOTE — Telephone Encounter (Signed)
Called and left voicemail for patient to call back to schedule CPE and labs.

## 2020-01-22 NOTE — Telephone Encounter (Signed)
Noted  

## 2020-01-27 ENCOUNTER — Encounter: Payer: Self-pay | Admitting: Family Medicine

## 2020-01-27 ENCOUNTER — Other Ambulatory Visit: Payer: Self-pay

## 2020-01-27 ENCOUNTER — Ambulatory Visit (INDEPENDENT_AMBULATORY_CARE_PROVIDER_SITE_OTHER): Payer: Medicare (Managed Care) | Admitting: Family Medicine

## 2020-01-27 VITALS — BP 138/84 | HR 78 | Temp 97.9°F | Ht 69.5 in | Wt 343.0 lb

## 2020-01-27 DIAGNOSIS — E785 Hyperlipidemia, unspecified: Secondary | ICD-10-CM

## 2020-01-27 DIAGNOSIS — I10 Essential (primary) hypertension: Secondary | ICD-10-CM

## 2020-01-27 DIAGNOSIS — R7303 Prediabetes: Secondary | ICD-10-CM | POA: Diagnosis not present

## 2020-01-27 LAB — COMPREHENSIVE METABOLIC PANEL
ALT: 13 U/L (ref 0–53)
AST: 13 U/L (ref 0–37)
Albumin: 4.1 g/dL (ref 3.5–5.2)
Alkaline Phosphatase: 70 U/L (ref 39–117)
BUN: 19 mg/dL (ref 6–23)
CO2: 32 mEq/L (ref 19–32)
Calcium: 9.8 mg/dL (ref 8.4–10.5)
Chloride: 100 mEq/L (ref 96–112)
Creatinine, Ser: 1.07 mg/dL (ref 0.40–1.50)
GFR: 83.9 mL/min (ref >60.00)
Glucose, Bld: 104 mg/dL — ABNORMAL HIGH (ref 70–99)
Potassium: 4.3 mEq/L (ref 3.5–5.1)
Sodium: 140 mEq/L (ref 135–145)
Total Bilirubin: 0.6 mg/dL (ref 0.2–1.2)
Total Protein: 7.1 g/dL (ref 6.0–8.3)

## 2020-01-27 LAB — LIPID PANEL
Cholesterol: 183 mg/dL (ref 0–200)
HDL: 77.2 mg/dL (ref >39.00)
LDL Cholesterol: 76 mg/dL (ref 0–99)
NonHDL: 106.09
Total CHOL/HDL Ratio: 2
Triglycerides: 151 mg/dL — ABNORMAL HIGH (ref 0.0–149.0)
VLDL: 30.2 mg/dL (ref 0.0–40.0)

## 2020-01-27 LAB — TSH: TSH: 1.08 u[IU]/mL (ref 0.35–4.50)

## 2020-01-27 LAB — HEMOGLOBIN A1C: Hgb A1c MFr Bld: 5.9 % (ref 4.6–6.5)

## 2020-01-27 MED ORDER — AMLODIPINE BESYLATE 10 MG PO TABS: 10.0000 mg | ORAL_TABLET | Freq: Every day | ORAL | 3 refills | Status: DC

## 2020-01-27 MED ORDER — ATORVASTATIN CALCIUM 40 MG PO TABS: 40.0000 mg | ORAL_TABLET | Freq: Every day | ORAL | 3 refills | Status: DC

## 2020-01-27 MED ORDER — FISH OIL 1000 MG PO CAPS: 2.0000 | ORAL_CAPSULE | Freq: Every day | ORAL | Status: DC

## 2020-01-27 NOTE — Patient Instructions (Addendum)
Labs today Medicines refilled. Call insurance to verify coverage. Return as needed or for welcome to medicare visit.

## 2020-01-27 NOTE — Progress Notes (Signed)
This visit was conducted in person.  BP 138/84 (BP Location: Left Arm, Patient Position: Sitting, Cuff Size: Large)   Pulse 78   Temp 97.9 F (36.6 C) (Temporal)   Ht 5' 9.5" (1.765 m)   Wt (!) 343 lb (155.6 kg)   SpO2 97%   BMI 49.93 kg/m    CC: med refill visit Subjective:    Patient ID: Philip Middleton, male    DOB: [birth year], 64 y.o.   MRN: [id number]  HPI: Philip Middleton is a 64 y.o. male presenting on 01/27/2020 for Hypertension (Here for f/u and med refills. )   Last seen 08/2018. Retired 11/2018.   Weight gain noted. Walks with cane - told needs L hip and R knee replacement but that he needed to drop weight to 250lbs prior to surgery.   HTN - Compliant with current antihypertensive regimen of amlodipine 10mg  daily. Does not check blood pressures at home. Doesn't have meter. No low blood pressure readings or symptoms of dizziness/syncope.  Denies HA, vision changes, CP/tightness, SOB, leg swelling. 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   Fasting today.      Relevant past medical, surgical, family and social history reviewed and updated as indicated. Interim medical history since our last visit reviewed. Allergies and medications reviewed and updated. Outpatient Medications Prior to Visit  Medication Sig Dispense Refill  . Multiple Vitamin (MULTIVITAMIN) tablet Take 1 tablet by mouth daily.      . naproxen sodium (ALEVE) 220 MG tablet Take 2 tablets (440 mg total) by mouth daily as needed.    . sildenafil (VIAGRA) 100 MG tablet Take 0.5-1 tablets (50-100 mg total) by mouth daily as needed for erectile dysfunction. 5 tablet 11  . amLODipine (NORVASC) 10 MG tablet Take 1 tablet (10 mg total) by mouth daily. NEEDS VISIT WITH PROVIDER 30 tablet 0  . atorvastatin (LIPITOR) 40 MG tablet TAKE 1 TABLET (40 MG TOTAL) BY MOUTH DAILY AT 6 PM. 90 tablet 2  . Omega-3 Fatty Acids (FISH OIL) 1000 MG CAPS Take 1 capsule by mouth 2 (two) times daily.    Marland Kitchen aspirin EC 81 MG tablet  Take 81 mg by mouth daily.    . Cyanocobalamin (B-12) 1000 MCG SUBL Place 1 tablet under the tongue daily. 30 each   . sulfamethoxazole-trimethoprim (BACTRIM DS,SEPTRA DS) 800-160 MG tablet Take 1 tablet by mouth 2 (two) times daily. 28 tablet 0  . Turmeric 500 MG CAPS Take 1 capsule by mouth daily.     No facility-administered medications prior to visit.     Per HPI unless specifically indicated in ROS section below Review of Systems Objective:    BP 138/84 (BP Location: Left Arm, Patient Position: Sitting, Cuff Size: Large)   Pulse 78   Temp 97.9 F (36.6 C) (Temporal)   Ht 5' 9.5" (1.765 m)   Wt (!) 343 lb (155.6 kg)   SpO2 97%   BMI 49.93 kg/m   Wt Readings from Last 3 Encounters:  01/27/20 (!) 343 lb (155.6 kg)  09/12/18 (!) 319 lb 12 oz (145 kg)  03/11/18 (!) 315 lb 12 oz (143.2 kg)    Physical Exam Vitals and nursing note reviewed.  Constitutional:      Appearance: Normal appearance. He is obese. He is not ill-appearing.  Eyes:     Extraocular Movements: Extraocular movements intact.     Pupils: Pupils are equal, round, and reactive to light.  Cardiovascular:     Rate and Rhythm: Normal  rate and regular rhythm.     Pulses: Normal pulses.     Heart sounds: Normal heart sounds. No murmur.  Pulmonary:     Effort: Pulmonary effort is normal. No respiratory distress.     Breath sounds: Normal breath sounds. No wheezing, rhonchi or rales.  Musculoskeletal:        General: Swelling present.     Right lower leg: Edema present.     Left lower leg: Edema present.     Comments: Chronic mild pitting edema superimposed on lymphedema bilaterally  Skin:    General: Skin is warm and dry.     Findings: No rash.  Neurological:     Mental Status: He is alert.  Psychiatric:        Mood and Affect: Mood normal.        Behavior: Behavior normal.       Results for orders placed or performed in visit on 01/27/20  Comprehensive metabolic panel  Result Value Ref Range   Sodium  140 135 - 145 mEq/L   Potassium 4.3 3.5 - 5.1 mEq/L   Chloride 100 96 - 112 mEq/L   CO2 32 19 - 32 mEq/L   Glucose, Bld 104 (H) 70 - 99 mg/dL   BUN 19 6 - 23 mg/dL   Creatinine, Ser 1.07 0.40 - 1.50 mg/dL   Total Bilirubin 0.6 0.2 - 1.2 mg/dL   Alkaline Phosphatase 70 39 - 117 U/L   AST 13 0 - 37 U/L   ALT 13 0 - 53 U/L   Total Protein 7.1 6.0 - 8.3 g/dL   Albumin 4.1 3.5 - 5.2 g/dL   GFR 83.90 >60.00 mL/min   Calcium 9.8 8.4 - 10.5 mg/dL  Lipid panel  Result Value Ref Range   Cholesterol 183 0 - 200 mg/dL   Triglycerides 151.0 (H) 0.0 - 149.0 mg/dL   HDL 77.20 >39.00 mg/dL   VLDL 30.2 0.0 - 40.0 mg/dL   LDL Cholesterol 76 0 - 99 mg/dL   Total CHOL/HDL Ratio 2    NonHDL 106.09   TSH  Result Value Ref Range   TSH 1.08 0.35 - 4.50 uIU/mL  Hemoglobin A1c  Result Value Ref Range   Hgb A1c MFr Bld 5.9 4.6 - 6.5 %   Assessment & Plan:  This visit occurred during the SARS-CoV-2 public health emergency.  Safety protocols were in place, including screening questions prior to the visit, additional usage of staff PPE, and extensive cleaning of exam room while observing appropriate contact time as indicated for disinfecting solutions.   Problem List Items Addressed This Visit    Severe obesity (BMI >= 40) (HCC)    Discussed weight gain noted. Encouraged focusing on diet changes for sustainable weight loss as activity limited by knees and hips.       Prediabetes    Update A1c.       Relevant Orders   Hemoglobin A1c (Completed)   HLD (hyperlipidemia)    Update FLP on daily atorvastatin. Med refilled. The 10-year ASCVD risk score Mikey Bussing [name]., et al., 2013) is: 15.9%   Values used to calculate the score:     Age: 23 years     Sex: Male     Is Non-Hispanic African American: Yes     Diabetic: No     Tobacco smoker: No     Systolic Blood Pressure: 0000000 mmHg     Is BP treated: Yes     HDL Cholesterol: 77.2 mg/dL  Total Cholesterol: 183 mg/dL       Relevant Medications    amLODipine (NORVASC) 10 MG tablet   atorvastatin (LIPITOR) 40 MG tablet   Other Relevant Orders   Comprehensive metabolic panel (Completed)   Lipid panel (Completed)   TSH (Completed)   Essential hypertension - Primary    Chronic, stable on amlodipine - continue. Check labs.       Relevant Medications   amLODipine (NORVASC) 10 MG tablet   atorvastatin (LIPITOR) 40 MG tablet       Meds ordered this encounter  Medications  . amLODipine (NORVASC) 10 MG tablet    Sig: Take 1 tablet (10 mg total) by mouth daily.    Dispense:  90 tablet    Refill:  3  . atorvastatin (LIPITOR) 40 MG tablet    Sig: Take 1 tablet (40 mg total) by mouth daily at 6 PM.    Dispense:  90 tablet    Refill:  3  . Omega-3 Fatty Acids (FISH OIL) 1000 MG CAPS    Sig: Take 2 capsules (2,000 mg total) by mouth daily.   Orders Placed This Encounter  Procedures  . Comprehensive metabolic panel  . Lipid panel  . TSH  . Hemoglobin A1c    Patient Instructions  Labs today Medicines refilled. Call insurance to verify coverage. Return as needed or for welcome to medicare visit.    Follow up plan: Return if symptoms worsen or fail to improve.  Ria Bush, MD

## 2020-01-28 NOTE — Assessment & Plan Note (Signed)
Update FLP on daily atorvastatin. Med refilled. The 10-year ASCVD risk score Philip Middleton [name]., et al., 2013) is: 15.9%   Values used to calculate the score:     Age: 64 years     Sex: Male     Is Non-Hispanic African American: Yes     Diabetic: No     Tobacco smoker: No     Systolic Blood Pressure: 0000000 mmHg     Is BP treated: Yes     HDL Cholesterol: 77.2 mg/dL     Total Cholesterol: 183 mg/dL

## 2020-01-28 NOTE — Assessment & Plan Note (Signed)
Update A1c ?

## 2020-01-28 NOTE — Assessment & Plan Note (Signed)
Chronic, stable on amlodipine - continue. Check labs.

## 2020-01-28 NOTE — Assessment & Plan Note (Signed)
Discussed weight gain noted. Encouraged focusing on diet changes for sustainable weight loss as activity limited by knees and hips.

## 2020-06-09 ENCOUNTER — Ambulatory Visit (INDEPENDENT_AMBULATORY_CARE_PROVIDER_SITE_OTHER): Payer: Medicare (Managed Care) | Admitting: Family Medicine

## 2020-06-09 ENCOUNTER — Encounter: Payer: Self-pay | Admitting: Family Medicine

## 2020-06-09 ENCOUNTER — Other Ambulatory Visit: Payer: Self-pay

## 2020-06-09 VITALS — BP 134/80 | HR 78 | Temp 98.0°F | Ht 69.5 in | Wt 336.0 lb

## 2020-06-09 DIAGNOSIS — R300 Dysuria: Secondary | ICD-10-CM

## 2020-06-09 DIAGNOSIS — R319 Hematuria, unspecified: Secondary | ICD-10-CM | POA: Diagnosis not present

## 2020-06-09 DIAGNOSIS — N401 Enlarged prostate with lower urinary tract symptoms: Secondary | ICD-10-CM

## 2020-06-09 DIAGNOSIS — N411 Chronic prostatitis: Secondary | ICD-10-CM

## 2020-06-09 DIAGNOSIS — R351 Nocturia: Secondary | ICD-10-CM

## 2020-06-09 LAB — POC URINALSYSI DIPSTICK (AUTOMATED)
Glucose, UA: NEGATIVE
Ketones, UA: NEGATIVE
Leukocytes, UA: NEGATIVE
Nitrite, UA: NEGATIVE
Protein, UA: POSITIVE — AB
Spec Grav, UA: 1.025 (ref 1.010–1.025)
Urobilinogen, UA: 0.2 E.U./dL
pH, UA: 6 (ref 5.0–8.0)

## 2020-06-09 NOTE — Patient Instructions (Addendum)
Urine overall without signs of infection but we have sent urine culture to verify.  May try over the counter lotrimin (clotrimazole) antifungal cream to private area as yeast infection can sometimes present with this pain.  Push fluids and rest, avoid bladder irritants.  We will be in touch with results.

## 2020-06-09 NOTE — Progress Notes (Signed)
This visit was conducted in person.  BP 134/80 (BP Location: Left Arm, Patient Position: Sitting, Cuff Size: Large)    Pulse 78    Temp 98 F (36.7 C) (Temporal)    Ht 5' 9.5" (1.765 m)    Wt (!) 336 lb (152.4 kg)    SpO2 96%    BMI 48.91 kg/m    CC: ?UTI Subjective:    Patient ID: Philip Middleton, male    DOB: 25-Apr-1956, 63 y.o.   MRN: [id number]  HPI: Philip Middleton is a 64 y.o. male presenting on 06/09/2020 for Dysuria (C/o burning when urinating, worsening.  Started about 2 wks ago. )   1-2 wk h/o dysuria with voiding, associated with urgency and nocturia.  Previously treated for acute cystitis with hematuria as well as h/o chronic prostatitis (2019).   No fevers/chills, abd pain, flank pain, nausea/vomiting. No hematuria. No rectal pain/pressure.  No recent abx use.  No urethral discharge. No new rashes.  No new partners.   Drinks occasional caffeine. Good water intake.  Ex smoker quit 2005     Relevant past medical, surgical, family and social history reviewed and updated as indicated. Interim medical history since our last visit reviewed. Allergies and medications reviewed and updated. Outpatient Medications Prior to Visit  Medication Sig Dispense Refill   amLODipine (NORVASC) 10 MG tablet Take 1 tablet (10 mg total) by mouth daily. 90 tablet 3   atorvastatin (LIPITOR) 40 MG tablet Take 1 tablet (40 mg total) by mouth daily at 6 PM. 90 tablet 3   Multiple Vitamin (MULTIVITAMIN) tablet Take 1 tablet by mouth daily.       naproxen sodium (ALEVE) 220 MG tablet Take 2 tablets (440 mg total) by mouth daily as needed.     Omega-3 Fatty Acids (FISH OIL) 1000 MG CAPS Take 2 capsules (2,000 mg total) by mouth daily.     sildenafil (VIAGRA) 100 MG tablet Take 0.5-1 tablets (50-100 mg total) by mouth daily as needed for erectile dysfunction. 5 tablet 11   No facility-administered medications prior to visit.     Per HPI unless specifically indicated in ROS section  below Review of Systems Objective:  BP 134/80 (BP Location: Left Arm, Patient Position: Sitting, Cuff Size: Large)    Pulse 78    Temp 98 F (36.7 C) (Temporal)    Ht 5' 9.5" (1.765 m)    Wt (!) 336 lb (152.4 kg)    SpO2 96%    BMI 48.91 kg/m   Wt Readings from Last 3 Encounters:  06/09/20 (!) 336 lb (152.4 kg)  01/27/20 (!) 343 lb (155.6 kg)  09/12/18 (!) 319 lb 12 oz (145 kg)      Physical Exam Vitals and nursing note reviewed.  Constitutional:      Appearance: Normal appearance. He is obese. He is not ill-appearing.  Abdominal:     General: Abdomen is protuberant. Bowel sounds are normal. There is no distension.     Palpations: Abdomen is soft. There is no mass.     Tenderness: There is no abdominal tenderness. There is no right CVA tenderness, left CVA tenderness, guarding or rebound. Negative signs include Murphy's sign.     Hernia: No hernia is present. There is no hernia in the left inguinal area or right inguinal area.  Genitourinary:    Pubic Area: No rash.      Penis: Normal and uncircumcised. No phimosis or discharge.      Testes: Normal.  Right: Mass, tenderness or swelling not present. Right testis is descended.        Left: Mass, tenderness or swelling not present. Left testis is descended.  Musculoskeletal:        General: Swelling (nonpitting lymphedema) present.     Right lower leg: Edema (chronic) present.     Left lower leg: Edema (chronic) present.  Lymphadenopathy:     Lower Body: No right inguinal adenopathy. No left inguinal adenopathy.  Neurological:     Mental Status: He is alert.  Psychiatric:        Mood and Affect: Mood normal.        Behavior: Behavior normal.       Results for orders placed or performed in visit on 06/09/20  POCT Urinalysis Dipstick (Automated)  Result Value Ref Range   Color, UA yellow    Clarity, UA clear    Glucose, UA Negative Negative   Bilirubin, UA 1+    Ketones, UA negative    Spec Grav, UA 1.025 1.010 -  1.025   Blood, UA 1+    pH, UA 6.0 5.0 - 8.0   Protein, UA Positive (A) Negative   Urobilinogen, UA 0.2 0.2 or 1.0 E.U./dL   Nitrite, UA negative    Leukocytes, UA Negative Negative   Assessment & Plan:  This visit occurred during the SARS-CoV-2 public health emergency.  Safety protocols were in place, including screening questions prior to the visit, additional usage of staff PPE, and extensive cleaning of exam room while observing appropriate contact time as indicated for disinfecting solutions.   Problem List Items Addressed This Visit    Severe obesity (BMI >= 40) (HCC)   PROSTATITIS, CHRONIC    H/o this, today's symptoms not quite consistent with prostatitis.      Hematuria    Not on blood thinners.  Check UCx - if normal, consider further evaluation for hematuria.       Relevant Orders   Urine Culture   Dysuria - Primary    Endorses dysuria but UA without signs of pyuria or infection. Send UCx. Reviewed avoiding bladder irritants like caffeinated beverages, spicy foods, dark sodas. Trial lotrimin for possible yeast infection although no obvious fungal rash. Low threshold to start abx course       Relevant Orders   Urine Culture   POCT Urinalysis Dipstick (Automated) (Completed)   Benign prostatic hyperplasia       No orders of the defined types were placed in this encounter.  Orders Placed This Encounter  Procedures   Urine Culture   POCT Urinalysis Dipstick (Automated)    Patient Instructions  Urine overall without signs of infection but we have sent urine culture to verify.  May try over the counter lotrimin (clotrimazole) antifungal cream to private area as yeast infection can sometimes present with this pain.  Push fluids and rest, avoid bladder irritants.  We will be in touch with results.    Follow up plan: Return if symptoms worsen or fail to improve.  Ria Bush, MD

## 2020-06-10 ENCOUNTER — Encounter: Payer: Self-pay | Admitting: Family Medicine

## 2020-06-10 DIAGNOSIS — R300 Dysuria: Secondary | ICD-10-CM | POA: Insufficient documentation

## 2020-06-10 DIAGNOSIS — R319 Hematuria, unspecified: Secondary | ICD-10-CM | POA: Insufficient documentation

## 2020-06-10 LAB — URINE CULTURE
MICRO NUMBER:: 10709680
SPECIMEN QUALITY:: ADEQUATE

## 2020-06-10 NOTE — Assessment & Plan Note (Signed)
H/o this, today's symptoms not quite consistent with prostatitis.

## 2020-06-10 NOTE — Assessment & Plan Note (Signed)
Endorses dysuria but UA without signs of pyuria or infection. Send UCx. Reviewed avoiding bladder irritants like caffeinated beverages, spicy foods, dark sodas. Trial lotrimin for possible yeast infection although no obvious fungal rash. Low threshold to start abx course

## 2020-06-10 NOTE — Assessment & Plan Note (Signed)
Not on blood thinners.  Check UCx - if normal, consider further evaluation for hematuria.

## 2020-06-11 ENCOUNTER — Other Ambulatory Visit: Payer: Self-pay | Admitting: Family Medicine

## 2020-06-11 MED ORDER — SULFAMETHOXAZOLE-TRIMETHOPRIM 800-160 MG PO TABS: 1.0000 | ORAL_TABLET | Freq: Two times a day (BID) | ORAL | 0 refills | Status: DC

## 2021-01-17 ENCOUNTER — Other Ambulatory Visit: Payer: Self-pay | Admitting: Family Medicine

## 2021-01-17 NOTE — Telephone Encounter (Signed)
E-scribed refill.  Plz schedule lab and cpe visits for welcome to Medicare (no wellness needed).

## 2021-03-11 ENCOUNTER — Other Ambulatory Visit: Payer: Self-pay | Admitting: Family Medicine

## 2021-03-11 DIAGNOSIS — I10 Essential (primary) hypertension: Secondary | ICD-10-CM

## 2021-03-11 DIAGNOSIS — M109 Gout, unspecified: Secondary | ICD-10-CM

## 2021-03-11 DIAGNOSIS — N401 Enlarged prostate with lower urinary tract symptoms: Secondary | ICD-10-CM

## 2021-03-11 DIAGNOSIS — R351 Nocturia: Secondary | ICD-10-CM

## 2021-03-11 DIAGNOSIS — E785 Hyperlipidemia, unspecified: Secondary | ICD-10-CM

## 2021-03-11 DIAGNOSIS — R7303 Prediabetes: Secondary | ICD-10-CM

## 2021-03-14 ENCOUNTER — Other Ambulatory Visit (INDEPENDENT_AMBULATORY_CARE_PROVIDER_SITE_OTHER): Payer: Medicare Other

## 2021-03-14 ENCOUNTER — Other Ambulatory Visit: Payer: Self-pay

## 2021-03-14 DIAGNOSIS — I10 Essential (primary) hypertension: Secondary | ICD-10-CM

## 2021-03-14 DIAGNOSIS — R351 Nocturia: Secondary | ICD-10-CM

## 2021-03-14 DIAGNOSIS — M109 Gout, unspecified: Secondary | ICD-10-CM | POA: Diagnosis not present

## 2021-03-14 DIAGNOSIS — R7303 Prediabetes: Secondary | ICD-10-CM

## 2021-03-14 DIAGNOSIS — N401 Enlarged prostate with lower urinary tract symptoms: Secondary | ICD-10-CM

## 2021-03-14 DIAGNOSIS — E785 Hyperlipidemia, unspecified: Secondary | ICD-10-CM

## 2021-03-14 LAB — COMPREHENSIVE METABOLIC PANEL
ALT: 21 U/L (ref 0–53)
AST: 16 U/L (ref 0–37)
Albumin: 4.3 g/dL (ref 3.5–5.2)
Alkaline Phosphatase: 77 U/L (ref 39–117)
BUN: 15 mg/dL (ref 6–23)
CO2: 30 mEq/L (ref 19–32)
Calcium: 9.4 mg/dL (ref 8.4–10.5)
Chloride: 101 mEq/L (ref 96–112)
Creatinine, Ser: 0.93 mg/dL (ref 0.40–1.50)
GFR: 85.73 mL/min (ref >60.00)
Glucose, Bld: 102 mg/dL — ABNORMAL HIGH (ref 70–99)
Potassium: 3.8 mEq/L (ref 3.5–5.1)
Sodium: 141 mEq/L (ref 135–145)
Total Bilirubin: 0.6 mg/dL (ref 0.2–1.2)
Total Protein: 7.3 g/dL (ref 6.0–8.3)

## 2021-03-14 LAB — LIPID PANEL
Cholesterol: 185 mg/dL (ref 0–200)
HDL: 71 mg/dL (ref >39.00)
LDL Cholesterol: 82 mg/dL (ref 0–99)
NonHDL: 114.33
Total CHOL/HDL Ratio: 3
Triglycerides: 161 mg/dL — ABNORMAL HIGH (ref 0.0–149.0)
VLDL: 32.2 mg/dL (ref 0.0–40.0)

## 2021-03-14 LAB — MICROALBUMIN / CREATININE URINE RATIO
Creatinine,U: 355.6 mg/dL
Microalb Creat Ratio: 7.6 mg/g (ref 0.0–30.0)
Microalb, Ur: 27 mg/dL — ABNORMAL HIGH (ref 0.0–1.9)

## 2021-03-14 LAB — CBC WITH DIFFERENTIAL/PLATELET
Basophils Absolute: 0.1 10*3/uL (ref 0.0–0.1)
Basophils Relative: 0.7 % (ref 0.0–3.0)
Eosinophils Absolute: 0.1 10*3/uL (ref 0.0–0.7)
Eosinophils Relative: 1.5 % (ref 0.0–5.0)
HCT: 42.3 % (ref 39.0–52.0)
Hemoglobin: 14 g/dL (ref 13.0–17.0)
Lymphocytes Relative: 33.9 % (ref 12.0–46.0)
Lymphs Abs: 3 10*3/uL (ref 0.7–4.0)
MCHC: 33.1 g/dL (ref 30.0–36.0)
MCV: 97.8 fl (ref 78.0–100.0)
Monocytes Absolute: 0.7 10*3/uL (ref 0.1–1.0)
Monocytes Relative: 7.9 % (ref 3.0–12.0)
Neutro Abs: 4.9 10*3/uL (ref 1.4–7.7)
Neutrophils Relative %: 56 % (ref 43.0–77.0)
Platelets: 211 10*3/uL (ref 150.0–400.0)
RBC: 4.32 Mil/uL (ref 4.22–5.81)
RDW: 14.6 % (ref 11.5–15.5)
WBC: 8.8 10*3/uL (ref 4.0–10.5)

## 2021-03-14 LAB — PSA: PSA: 0.78 ng/mL (ref 0.10–4.00)

## 2021-03-14 LAB — URIC ACID: Uric Acid, Serum: 7 mg/dL (ref 4.0–7.8)

## 2021-03-14 LAB — HEMOGLOBIN A1C: Hgb A1c MFr Bld: 6.1 % (ref 4.6–6.5)

## 2021-03-21 ENCOUNTER — Encounter: Payer: Self-pay | Admitting: Family Medicine

## 2021-03-21 ENCOUNTER — Other Ambulatory Visit: Payer: Self-pay

## 2021-03-21 ENCOUNTER — Ambulatory Visit (INDEPENDENT_AMBULATORY_CARE_PROVIDER_SITE_OTHER): Payer: Medicare Other | Admitting: Family Medicine

## 2021-03-21 VITALS — BP 134/76 | HR 85 | Temp 97.7°F | Ht 70.0 in | Wt 337.4 lb

## 2021-03-21 DIAGNOSIS — Z860100 Personal history of colon polyps, unspecified: Secondary | ICD-10-CM

## 2021-03-21 DIAGNOSIS — Z86711 Personal history of pulmonary embolism: Secondary | ICD-10-CM

## 2021-03-21 DIAGNOSIS — I10 Essential (primary) hypertension: Secondary | ICD-10-CM | POA: Diagnosis not present

## 2021-03-21 DIAGNOSIS — I440 Atrioventricular block, first degree: Secondary | ICD-10-CM

## 2021-03-21 DIAGNOSIS — M109 Gout, unspecified: Secondary | ICD-10-CM

## 2021-03-21 DIAGNOSIS — R600 Localized edema: Secondary | ICD-10-CM

## 2021-03-21 DIAGNOSIS — Z23 Encounter for immunization: Secondary | ICD-10-CM | POA: Diagnosis not present

## 2021-03-21 DIAGNOSIS — Z86718 Personal history of other venous thrombosis and embolism: Secondary | ICD-10-CM | POA: Diagnosis not present

## 2021-03-21 DIAGNOSIS — Z1211 Encounter for screening for malignant neoplasm of colon: Secondary | ICD-10-CM | POA: Diagnosis not present

## 2021-03-21 DIAGNOSIS — Z7189 Other specified counseling: Secondary | ICD-10-CM | POA: Diagnosis not present

## 2021-03-21 DIAGNOSIS — N401 Enlarged prostate with lower urinary tract symptoms: Secondary | ICD-10-CM

## 2021-03-21 DIAGNOSIS — R351 Nocturia: Secondary | ICD-10-CM

## 2021-03-21 DIAGNOSIS — E785 Hyperlipidemia, unspecified: Secondary | ICD-10-CM

## 2021-03-21 DIAGNOSIS — R7303 Prediabetes: Secondary | ICD-10-CM

## 2021-03-21 DIAGNOSIS — Z0000 Encounter for general adult medical examination without abnormal findings: Secondary | ICD-10-CM | POA: Diagnosis not present

## 2021-03-21 MED ORDER — LOSARTAN POTASSIUM 50 MG PO TABS: 50.0000 mg | ORAL_TABLET | Freq: Every day | ORAL | 3 refills | Status: DC

## 2021-03-21 MED ORDER — ATORVASTATIN CALCIUM 40 MG PO TABS: 40.0000 mg | ORAL_TABLET | Freq: Every day | ORAL | 3 refills | Status: DC

## 2021-03-21 MED ORDER — AMLODIPINE BESYLATE 10 MG PO TABS: 1.0000 | ORAL_TABLET | Freq: Every day | ORAL | 3 refills | Status: DC

## 2021-03-21 NOTE — Patient Instructions (Addendum)
EKG today  Prevnar-20 today  We will refer you for repeat colonoscopy.  Think about completing advanced directive/living will. Let us know if you'd like info about this.  Stop amlodipine. In its place start losartan 50mg  daily. Check kidney function 2 weeks after starting losartan.  Return in 1 month for hypertension follow up.  Return in 1 year for next wellness visit/physical.   Health Maintenance After Age 66 After age 31, you are at a higher risk for certain long-term diseases and infections as well as injuries from falls. Falls are a major cause of broken bones and head injuries in people who are older than age 31. Getting regular preventive care can help to keep you healthy and well. Preventive care includes getting regular testing and making lifestyle changes as recommended by your health care provider. Talk with your health care provider about:  Which screenings and tests you should have. A screening is a test that checks for a disease when you have no symptoms.  A diet and exercise plan that is right for you. What should I know about screenings and tests to prevent falls? Screening and testing are the best ways to find a health problem early. Early diagnosis and treatment give you the best chance of managing medical conditions that are common after age 37. Certain conditions and lifestyle choices may make you more likely to have a fall. Your health care provider may recommend:  Regular vision checks. Poor vision and conditions such as cataracts can make you more likely to have a fall. If you wear glasses, make sure to get your prescription updated if your vision changes.  Medicine review. Work with your health care provider to regularly review all of the medicines you are taking, including over-the-counter medicines. Ask your health care provider about any side effects that may make you more likely to have a fall. Tell your health care provider if any medicines that you take make you feel  dizzy or sleepy.  Osteoporosis screening. Osteoporosis is a condition that causes the bones to get weaker. This can make the bones weak and cause them to break more easily.  Blood pressure screening. Blood pressure changes and medicines to control blood pressure can make you feel dizzy.  Strength and balance checks. Your health care provider may recommend certain tests to check your strength and balance while standing, walking, or changing positions.  Foot health exam. Foot pain and numbness, as well as not wearing proper footwear, can make you more likely to have a fall.  Depression screening. You may be more likely to have a fall if you have a fear of falling, feel emotionally low, or feel unable to do activities that you used to do.  Alcohol use screening. Using too much alcohol can affect your balance and may make you more likely to have a fall. What actions can I take to lower my risk of falls? General instructions  Talk with your health care provider about your risks for falling. Tell your health care provider if: ? You fall. Be sure to tell your health care provider about all falls, even ones that seem minor. ? You feel dizzy, sleepy, or off-balance.  Take over-the-counter and prescription medicines only as told by your health care provider. These include any supplements.  Eat a healthy diet and maintain a healthy weight. A healthy diet includes low-fat dairy products, low-fat (lean) meats, and fiber from whole grains, beans, and lots of fruits and vegetables. Home safety  Remove any  tripping hazards, such as rugs, cords, and clutter.  Install safety equipment such as grab bars in bathrooms and safety rails on stairs.  Keep rooms and walkways well-lit. Activity  Follow a regular exercise program to stay fit. This will help you maintain your balance. Ask your health care provider what types of exercise are appropriate for you.  If you need a cane or walker, use it as  recommended by your health care provider.  Wear supportive shoes that have nonskid soles.   Lifestyle  Do not drink alcohol if your health care provider tells you not to drink.  If you drink alcohol, limit how much you have: ? 0-1 drink a day for women. ? 0-2 drinks a day for men.  Be aware of how much alcohol is in your drink. In the [place] one drink equals one typical bottle of beer (12 oz), one-half glass of wine (5 oz), or one shot of hard liquor (1 oz).  Do not use any products that contain nicotine or tobacco, such as cigarettes and e-cigarettes. If you need help quitting, ask your health care provider. Summary  Having a healthy lifestyle and getting preventive care can help to protect your health and wellness after age 32.  Screening and testing are the best way to find a health problem early and help you avoid having a fall. Early diagnosis and treatment give you the best chance for managing medical conditions that are more common for people who are older than age 52.  Falls are a major cause of broken bones and head injuries in people who are older than age 75. Take precautions to prevent a fall at home.  Work with your health care provider to learn what changes you can make to improve your health and wellness and to prevent falls. This information is not intended to replace advice given to you by your health care provider. Make sure you discuss any questions you have with your health care provider. Document Revised: 03/05/2019 Document Reviewed: 09/25/2017 Elsevier Patient Education  2021 [employer].

## 2021-03-21 NOTE — Progress Notes (Signed)
Patient ID: Philip Middleton, male    DOB: 1955/12/20, 65 y.o.   MRN: [id number]  This visit was conducted in person.  BP 134/76   Pulse 85   Temp 97.7 F (36.5 C) (Temporal)   Ht 5\' 10"  (1.778 m)   Wt (!) 337 lb 6 oz (153 kg)   SpO2 97%   BMI 48.41 kg/m    CC: welcome to medicare  Subjective:   HPI: Philip Middleton is a 65 y.o. male presenting on 03/21/2021 for Welcome To Medicare    Hearing Screening   125Hz  250Hz  500Hz  1000Hz  2000Hz  3000Hz  4000Hz  6000Hz  8000Hz   Right ear:   20 25 20  20     Left ear:   20 20 20  20       Visual Acuity Screening   Right eye Left eye Both eyes  Without correction:     With correction: 20/20 20/23 20/20     Flowsheet Row Office Visit from 03/21/2021 in [facility] [place]  PHQ-2 Total Score 0      Fall Risk  03/21/2021  Falls in the past year? 0    Retired 2020.   Preventative: Colonoscopy 06/2017 3 TA, rpt 3 yrs [name]) - will return.  Prostate screening - uncle with prostate cancer. Checks yearly. DRE reassuring 02/2018.  Lung cancer screening - not eligible  Flu shotyearly  COVID vaccine Pfizer 04/2020 x2  Td 2012  Prevnar-20 today  Shingrix - discussed - to consider  Advanced directives - doesn't have set up. Declines pack. Unsure who HCPOA would be.  Seat belt use discussed Sunscreen use discussed. No changing moles on skin Non smoker Alcohol - denies significant alcohol use however on medicare questionairre endorses 1-2 drinks/day Dentist Q6 mo Eye exam yearly Bowel - no constipation Bladder - some urge incontinence noted   Caffeine: 2-3 cups coffee/day  Lives alone, no pets, 1 child  Occupation: Proofreader, warehouse  Activity: no regular exercise  Diet: good water, daily fruits/vegetables     Relevant past medical, surgical, family and social history reviewed and updated as indicated. Interim medical history since our last visit reviewed. Allergies and medications reviewed and  updated. Outpatient Medications Prior to Visit  Medication Sig Dispense Refill  . Multiple Vitamin (MULTIVITAMIN) tablet Take 1 tablet by mouth daily.    . naproxen sodium (ALEVE) 220 MG tablet Take 2 tablets (440 mg total) by mouth daily as needed.    . Omega-3 Fatty Acids (FISH OIL) 1000 MG CAPS Take 2 capsules (2,000 mg total) by mouth daily.    . sildenafil (VIAGRA) 100 MG tablet Take 0.5-1 tablets (50-100 mg total) by mouth daily as needed for erectile dysfunction. 5 tablet 11  . amLODipine (NORVASC) 10 MG tablet TAKE 1 TABLET BY MOUTH EVERY DAY 90 tablet 0  . atorvastatin (LIPITOR) 40 MG tablet Take 1 tablet (40 mg total) by mouth daily at 6 PM. 90 tablet 3  . sulfamethoxazole-trimethoprim (BACTRIM DS) 800-160 MG tablet Take 1 tablet by mouth 2 (two) times daily. 14 tablet 0   No facility-administered medications prior to visit.     Per HPI unless specifically indicated in ROS section below Review of Systems Objective:  BP 134/76   Pulse 85   Temp 97.7 F (36.5 C) (Temporal)   Ht 5\' 10"  (1.778 m)   Wt (!) 337 lb 6 oz (153 kg)   SpO2 97%   BMI 48.41 kg/m   Wt Readings from Last 3 Encounters:  03/21/21 (!) 337 lb 6 oz (153 kg)  06/09/20 (!) 336 lb (152.4 kg)  01/27/20 (!) 343 lb (155.6 kg)      Physical Exam Vitals and nursing note reviewed.  Constitutional:      General: He is not in acute distress.    Appearance: Normal appearance. He is well-developed. He is obese. He is not ill-appearing.  HENT:     Head: Normocephalic and atraumatic.     Right Ear: Hearing, tympanic membrane, ear canal and external ear normal.     Left Ear: Hearing, tympanic membrane, ear canal and external ear normal.  Eyes:     General: No scleral icterus.    Extraocular Movements: Extraocular movements intact.     Conjunctiva/sclera: Conjunctivae normal.     Pupils: Pupils are equal, round, and reactive to light.  Neck:     Thyroid: No thyroid mass or thyromegaly.     Vascular: No carotid  bruit.  Cardiovascular:     Rate and Rhythm: Normal rate and regular rhythm.     Pulses: Normal pulses.          Radial pulses are 2+ on the right side and 2+ on the left side.     Heart sounds: Normal heart sounds. No murmur heard.   Pulmonary:     Effort: Pulmonary effort is normal. No respiratory distress.     Breath sounds: Normal breath sounds. No wheezing, rhonchi or rales.  Abdominal:     General: Bowel sounds are normal. There is no distension.     Palpations: Abdomen is soft. There is no mass.     Tenderness: There is no abdominal tenderness. There is no guarding or rebound.     Hernia: No hernia is present.  Musculoskeletal:        General: Normal range of motion.     Cervical back: Normal range of motion and neck supple.     Right lower leg: Edema (1+) present.     Left lower leg: Edema (1+) present.     Comments:  Diminished pedal pulses Negative Stemmer sign  Lymphadenopathy:     Cervical: No cervical adenopathy.  Skin:    General: Skin is warm and dry.     Findings: No rash.  Neurological:     General: No focal deficit present.     Mental Status: He is alert and oriented to person, place, and time.     Comments:  CN grossly intact, station and gait intact Recall 0/3, 1/3 Calculation 5/5 DLROW  Psychiatric:        Mood and Affect: Mood normal.        Behavior: Behavior normal.        Thought Content: Thought content normal.        Judgment: Judgment normal.       Results for orders placed or performed in visit on 03/14/21  CBC with Differential/Platelet  Result Value Ref Range   WBC 8.8 4.0 - 10.5 K/uL   RBC 4.32 4.22 - 5.81 Mil/uL   Hemoglobin 14.0 13.0 - 17.0 g/dL   HCT 42.3 39.0 - 52.0 %   MCV 97.8 78.0 - 100.0 fl   MCHC 33.1 30.0 - 36.0 g/dL   RDW 14.6 11.5 - 15.5 %   Platelets 211.0 150.0 - 400.0 K/uL   Neutrophils Relative % 56.0 43.0 - 77.0 %   Lymphocytes Relative 33.9 12.0 - 46.0 %   Monocytes Relative 7.9 3.0 - 12.0 %   Eosinophils  Relative 1.5 0.0 - 5.0 %   Basophils Relative 0.7 0.0 - 3.0 %   Neutro Abs 4.9 1.4 - 7.7 K/uL   Lymphs Abs 3.0 0.7 - 4.0 K/uL   Monocytes Absolute 0.7 0.1 - 1.0 K/uL   Eosinophils Absolute 0.1 0.0 - 0.7 K/uL   Basophils Absolute 0.1 0.0 - 0.1 K/uL  PSA  Result Value Ref Range   PSA 0.78 0.10 - 4.00 ng/mL  Microalbumin / creatinine urine ratio  Result Value Ref Range   Microalb, Ur 27.0 (H) 0.0 - 1.9 mg/dL   Creatinine,U 355.6 mg/dL   Microalb Creat Ratio 7.6 0.0 - 30.0 mg/g  Uric acid  Result Value Ref Range   Uric Acid, Serum 7.0 4.0 - 7.8 mg/dL  Hemoglobin A1c  Result Value Ref Range   Hgb A1c MFr Bld 6.1 4.6 - 6.5 %  Comprehensive metabolic panel  Result Value Ref Range   Sodium 141 135 - 145 mEq/L   Potassium 3.8 3.5 - 5.1 mEq/L   Chloride 101 96 - 112 mEq/L   CO2 30 19 - 32 mEq/L   Glucose, Bld 102 (H) 70 - 99 mg/dL   BUN 15 6 - 23 mg/dL   Creatinine, Ser 0.93 0.40 - 1.50 mg/dL   Total Bilirubin 0.6 0.2 - 1.2 mg/dL   Alkaline Phosphatase 77 39 - 117 U/L   AST 16 0 - 37 U/L   ALT 21 0 - 53 U/L   Total Protein 7.3 6.0 - 8.3 g/dL   Albumin 4.3 3.5 - 5.2 g/dL   GFR 85.73 >60.00 mL/min   Calcium 9.4 8.4 - 10.5 mg/dL  Lipid panel  Result Value Ref Range   Cholesterol 185 0 - 200 mg/dL   Triglycerides 161.0 (H) 0.0 - 149.0 mg/dL   HDL 71.00 >39.00 mg/dL   VLDL 32.2 0.0 - 40.0 mg/dL   LDL Cholesterol 82 0 - 99 mg/dL   Total CHOL/HDL Ratio 3    NonHDL 114.33    EKG - NSR rate 70, normal axis, prolonged PR difficult to determine due to artifact, no acute ST/T changes appreciated Assessment & Plan:  This visit occurred during the SARS-CoV-2 public health emergency.  Safety protocols were in place, including screening questions prior to the visit, additional usage of staff PPE, and extensive cleaning of exam room while observing appropriate contact time as indicated for disinfecting solutions.   Problem List Items Addressed This Visit    HLD (hyperlipidemia)    Chronic,  stable. Continue atorvastatin. The 10-year ASCVD risk score Mikey Bussing [name]., et al., 2013) is: 16%   Values used to calculate the score:     Age: 67 years     Sex: Male     Is Non-Hispanic African American: Yes     Diabetic: No     Tobacco smoker: No     Systolic Blood Pressure: [id number] mmHg     Is BP treated: Yes     HDL Cholesterol: 71 mg/dL     Total Cholesterol: 185 mg/dL       Relevant Medications   atorvastatin (LIPITOR) 40 MG tablet   losartan (COZAAR) 50 MG tablet   Severe obesity (BMI >= 40) (HCC)    Encouraged healthy diet and lifestyle choices to affect sustainable weight loss. Discussed relation of weight with pedal edema      Essential hypertension    Chronic, stable on amlodipine. However given chronic pedal edema, will transition off amlodipine and onto losartan 50mg  daily. I  did ask him to return in 10 days to recheck Cr.       Relevant Medications   atorvastatin (LIPITOR) 40 MG tablet   losartan (COZAAR) 50 MG tablet   Other Relevant Orders   Basic metabolic panel   History of pulmonary embolism    Will need to verify he continues aspirin daily.       History of DVT (deep vein thrombosis)    H/o bilateral DVT. Not on anticoagulant. Will need to verify he continues aspirin daily.       Pedal edema    Chronic, multifactorial. Discussed concern for lymphedema developing given chronicity of edema.  Declines compression stockings due to pain with use.       Prediabetes    Discussed limiting added sugars and carbs.       Benign prostatic hyperplasia    PSA stable.       History of colonic polyps    Refer back to GI      Podagra    No recent gout flare, urate stable off urate lowering medicine.       Welcome to Medicare preventive visit - Primary    I have personally reviewed the Medicare Annual Wellness questionnaire and have noted 1. The patient's medical and social history 2. Their use of alcohol, tobacco or illicit drugs 3. Their current  medications and supplements 4. The patient's functional ability including ADL's, fall risks, home safety risks and hearing or visual impairment. Cognitive function has been assessed and addressed as indicated.  5. Diet and physical activity 6. Evidence for depression or mood disorders The patients weight, height, BMI have been recorded in the chart. I have made referrals, counseling and provided education to the patient based on review of the above and I have provided the pt with a written personalized care plan for preventive services. Provider list updated.. See scanned questionairre as needed for further documentation. Reviewed preventative protocols and updated unless pt declined.       Relevant Orders   EKG 12-Lead (Completed)   Advanced directives, counseling/discussion    Advanced directives - doesn't have set up. Declines pack. Unsure who HCPOA would be.       First degree AV block    Noted on EKG. Will avoid AV nodal blocking agents.       Relevant Medications   atorvastatin (LIPITOR) 40 MG tablet   losartan (COZAAR) 50 MG tablet    Other Visit Diagnoses    Special screening for malignant neoplasms, colon       Relevant Orders   Ambulatory referral to Gastroenterology   Need for vaccination against Streptococcus pneumoniae       Relevant Orders   Pneumococcal conjugate vaccine 20-valent (Completed)       Meds ordered this encounter  Medications  . atorvastatin (LIPITOR) 40 MG tablet    Sig: Take 1 tablet (40 mg total) by mouth daily at 6 PM.    Dispense:  90 tablet    Refill:  3  . DISCONTD: amLODipine (NORVASC) 10 MG tablet    Sig: Take 1 tablet (10 mg total) by mouth daily.    Dispense:  90 tablet    Refill:  3  . losartan (COZAAR) 50 MG tablet    Sig: Take 1 tablet (50 mg total) by mouth daily.    Dispense:  90 tablet    Refill:  3    To replace amlodipine   Orders Placed This Encounter  Procedures  .  Pneumococcal conjugate vaccine 20-valent  . Basic  metabolic panel    Standing Status:   Future    Standing Expiration Date:   03/22/2022  . Ambulatory referral to Gastroenterology    Referral Priority:   Routine    Referral Type:   Consultation    Referral Reason:   Specialty Services Required    Number of Visits Requested:   1  . EKG 12-Lead    Patient instuctions: [id number] today  We will refer you for repeat colonoscopy.  Think about completing advanced directive/living will. Let us know if you'd like info about this.  Stop amlodipine. In its place start losartan 50mg  daily. Check kidney function 2 weeks after starting losartan.  Return in 1 month for hypertension follow up.  Return in 1 year for next wellness visit/physical.   Follow up plan: Return in about 1 year (around 03/21/2022) for annual exam, prior fasting for blood work, medicare wellness visit.  Ria Bush, MD

## 2021-03-22 DIAGNOSIS — Z7189 Other specified counseling: Secondary | ICD-10-CM | POA: Insufficient documentation

## 2021-03-22 DIAGNOSIS — Z0000 Encounter for general adult medical examination without abnormal findings: Secondary | ICD-10-CM | POA: Insufficient documentation

## 2021-03-22 DIAGNOSIS — I440 Atrioventricular block, first degree: Secondary | ICD-10-CM | POA: Insufficient documentation

## 2021-03-22 NOTE — Assessment & Plan Note (Signed)
PSA stable

## 2021-03-22 NOTE — Assessment & Plan Note (Signed)
Encouraged healthy diet and lifestyle choices to affect sustainable weight loss. Discussed relation of weight with pedal edema

## 2021-03-22 NOTE — Assessment & Plan Note (Signed)
H/o bilateral DVT. Not on anticoagulant. Will need to verify he continues aspirin daily.

## 2021-03-22 NOTE — Assessment & Plan Note (Signed)
Chronic, multifactorial. Discussed concern for lymphedema developing given chronicity of edema.  Declines compression stockings due to pain with use.

## 2021-03-22 NOTE — Assessment & Plan Note (Addendum)
Chronic, stable. Continue atorvastatin. The 10-year ASCVD risk score Mikey Bussing [name]., et al., 2013) is: 16%   Values used to calculate the score:     Age: 65 years     Sex: Male     Is Non-Hispanic African American: Yes     Diabetic: No     Tobacco smoker: No     Systolic Blood Pressure: 786 mmHg     Is BP treated: Yes     HDL Cholesterol: 71 mg/dL     Total Cholesterol: 185 mg/dL

## 2021-03-22 NOTE — Assessment & Plan Note (Signed)
Advanced directives - doesn't have set up. Declines pack. Unsure who HCPOA would be.

## 2021-03-22 NOTE — Assessment & Plan Note (Signed)
No recent gout flare, urate stable off urate lowering medicine.

## 2021-03-22 NOTE — Assessment & Plan Note (Signed)

## 2021-03-22 NOTE — Assessment & Plan Note (Signed)
Refer back to GI. 

## 2021-03-22 NOTE — Assessment & Plan Note (Signed)
Will need to verify he continues aspirin daily.

## 2021-03-22 NOTE — Assessment & Plan Note (Signed)
Noted on EKG. Will avoid AV nodal blocking agents.

## 2021-03-22 NOTE — Assessment & Plan Note (Signed)
Discussed limiting added sugars and carbs.

## 2021-03-22 NOTE — Assessment & Plan Note (Addendum)
Chronic, stable on amlodipine. However given chronic pedal edema, will transition off amlodipine and onto losartan 50mg  daily. I did ask him to return in 10 days to recheck Cr.

## 2021-04-12 DIAGNOSIS — M1612 Unilateral primary osteoarthritis, left hip: Secondary | ICD-10-CM | POA: Diagnosis not present

## 2021-04-19 DIAGNOSIS — M25561 Pain in right knee: Secondary | ICD-10-CM | POA: Diagnosis not present

## 2021-04-21 ENCOUNTER — Encounter: Payer: Self-pay | Admitting: Family Medicine

## 2021-04-21 ENCOUNTER — Other Ambulatory Visit: Payer: Self-pay

## 2021-04-21 ENCOUNTER — Ambulatory Visit (INDEPENDENT_AMBULATORY_CARE_PROVIDER_SITE_OTHER): Payer: Medicare Other | Admitting: Family Medicine

## 2021-04-21 VITALS — BP 140/80 | HR 77 | Temp 97.7°F | Ht 70.0 in | Wt 339.5 lb

## 2021-04-21 DIAGNOSIS — I10 Essential (primary) hypertension: Secondary | ICD-10-CM | POA: Diagnosis not present

## 2021-04-21 DIAGNOSIS — R600 Localized edema: Secondary | ICD-10-CM | POA: Diagnosis not present

## 2021-04-21 DIAGNOSIS — Z86711 Personal history of pulmonary embolism: Secondary | ICD-10-CM | POA: Diagnosis not present

## 2021-04-21 DIAGNOSIS — I890 Lymphedema, not elsewhere classified: Secondary | ICD-10-CM

## 2021-04-21 DIAGNOSIS — Z86718 Personal history of other venous thrombosis and embolism: Secondary | ICD-10-CM | POA: Diagnosis not present

## 2021-04-21 LAB — BASIC METABOLIC PANEL
BUN: 21 mg/dL (ref 6–23)
CO2: 29 mEq/L (ref 19–32)
Calcium: 9.4 mg/dL (ref 8.4–10.5)
Chloride: 104 mEq/L (ref 96–112)
Creatinine, Ser: 1.06 mg/dL (ref 0.40–1.50)
GFR: 73.22 mL/min (ref >60.00)
Glucose, Bld: 101 mg/dL — ABNORMAL HIGH (ref 70–99)
Potassium: 4.3 mEq/L (ref 3.5–5.1)
Sodium: 141 mEq/L (ref 135–145)

## 2021-04-21 MED ORDER — ASPIRIN 81 MG PO TBEC: 81.0000 mg | DELAYED_RELEASE_TABLET | Freq: Every day | ORAL | Status: AC

## 2021-04-21 MED ORDER — FISH OIL 1000 MG PO CAPS: 1.0000 | ORAL_CAPSULE | Freq: Every day | ORAL | Status: AC

## 2021-04-21 NOTE — Assessment & Plan Note (Signed)
Chronic, likely multifactorial.  + stemmer sign suggests lymphedema. Difficult to palpate pedal pulses due to dorsal foot swelling.  Will retrial compression stockings and if unable to tolerate will further eval with ABIs.

## 2021-04-21 NOTE — Patient Instructions (Addendum)
Blood pressure is doing ok. Labs today  I think you have lymphedema in addition to leg swelling. Trial compression stockings - prescription provided today.  Return in 5-6 months for follow up visit.

## 2021-04-21 NOTE — Progress Notes (Signed)
Patient ID: Philip Middleton, male    DOB: [birth year], 65 y.o.   MRN: [id number]  This visit was conducted in person.  BP 140/80   Pulse 77   Temp 97.7 F (36.5 C) (Temporal)   Ht 5\' 10"  (1.778 m)   Wt (!) 339 lb 8 oz (154 kg)   SpO2 96%   BMI 48.71 kg/m    CC: HTN f/u visit  Subjective:   HPI: Philip Middleton is a 65 y.o. male presenting on 04/21/2021 for Hypertension (Here for 1 mo f/u.)   See prior note for details. Last visit we transitioned off amlodipine onto losartan 50mg  daily due to chronic pedal edema, concern for lymphedema. Due for Cr check with this change.  He had stopped aspirin 81mg  daily - advised restart this.  First degree AV block on EKG - avoiding AV nodal blocking agents.   HTN - Compliant with current antihypertensive regimen of losartan 50mg  daily. Does not check blood pressures at home. Doesn't have cuff. No low blood pressure readings or symptoms of dizziness/syncope. Denies HA, vision changes, CP/tightness, SOB, leg swelling is improved.    Recently had home insurance nurse come out and states had normal circulation eval. He does use Revitive     Relevant past medical, surgical, family and social history reviewed and updated as indicated. Interim medical history since our last visit reviewed. Allergies and medications reviewed and updated. Outpatient Medications Prior to Visit  Medication Sig Dispense Refill  . atorvastatin (LIPITOR) 40 MG tablet Take 1 tablet (40 mg total) by mouth daily at 6 PM. 90 tablet 3  . losartan (COZAAR) 50 MG tablet Take 1 tablet (50 mg total) by mouth daily. 90 tablet 3  . Multiple Vitamin (MULTIVITAMIN) tablet Take 1 tablet by mouth daily.    . naproxen sodium (ALEVE) 220 MG tablet Take 2 tablets (440 mg total) by mouth daily as needed.    . sildenafil (VIAGRA) 100 MG tablet Take 0.5-1 tablets (50-100 mg total) by mouth daily as needed for erectile dysfunction. 5 tablet 11  . Omega-3 Fatty Acids (FISH OIL) 1000 MG CAPS  Take 2 capsules (2,000 mg total) by mouth daily.     No facility-administered medications prior to visit.     Per HPI unless specifically indicated in ROS section below Review of Systems Objective:  BP 140/80   Pulse 77   Temp 97.7 F (36.5 C) (Temporal)   Ht 5\' 10"  (1.778 m)   Wt (!) 339 lb 8 oz (154 kg)   SpO2 96%   BMI 48.71 kg/m   Wt Readings from Last 3 Encounters:  04/21/21 (!) 339 lb 8 oz (154 kg)  03/21/21 (!) 337 lb 6 oz (153 kg)  06/09/20 (!) 336 lb (152.4 kg)      Physical Exam Vitals and nursing note reviewed.  Constitutional:      Appearance: Normal appearance. He is obese. He is not ill-appearing.  Cardiovascular:     Rate and Rhythm: Normal rate and regular rhythm.     Pulses: Normal pulses.     Heart sounds: Normal heart sounds. No murmur heard.   Pulmonary:     Effort: Pulmonary effort is normal. No respiratory distress.     Breath sounds: No wheezing, rhonchi or rales.  Musculoskeletal:        General: Swelling present. No tenderness.     Right lower leg: Edema (1+ pitting) present.     Left lower leg: Edema (1+ pitting)  present.     Comments:  Difficult to palpate pedal pulses Positive stemmer sign  Skin:    General: Skin is warm and dry.  Neurological:     Mental Status: He is alert.  Psychiatric:        Mood and Affect: Mood normal.        Behavior: Behavior normal.       Results for orders placed or performed in visit on 03/14/21  CBC with Differential/Platelet  Result Value Ref Range   WBC 8.8 4.0 - 10.5 K/uL   RBC 4.32 4.22 - 5.81 Mil/uL   Hemoglobin 14.0 13.0 - 17.0 g/dL   HCT 42.3 39.0 - 52.0 %   MCV 97.8 78.0 - 100.0 fl   MCHC 33.1 30.0 - 36.0 g/dL   RDW 14.6 11.5 - 15.5 %   Platelets 211.0 150.0 - 400.0 K/uL   Neutrophils Relative % 56.0 43.0 - 77.0 %   Lymphocytes Relative 33.9 12.0 - 46.0 %   Monocytes Relative 7.9 3.0 - 12.0 %   Eosinophils Relative 1.5 0.0 - 5.0 %   Basophils Relative 0.7 0.0 - 3.0 %   Neutro Abs 4.9  1.4 - 7.7 K/uL   Lymphs Abs 3.0 0.7 - 4.0 K/uL   Monocytes Absolute 0.7 0.1 - 1.0 K/uL   Eosinophils Absolute 0.1 0.0 - 0.7 K/uL   Basophils Absolute 0.1 0.0 - 0.1 K/uL  PSA  Result Value Ref Range   PSA 0.78 0.10 - 4.00 ng/mL  Microalbumin / creatinine urine ratio  Result Value Ref Range   Microalb, Ur 27.0 (H) 0.0 - 1.9 mg/dL   Creatinine,U 355.6 mg/dL   Microalb Creat Ratio 7.6 0.0 - 30.0 mg/g  Uric acid  Result Value Ref Range   Uric Acid, Serum 7.0 4.0 - 7.8 mg/dL  Hemoglobin A1c  Result Value Ref Range   Hgb A1c MFr Bld 6.1 4.6 - 6.5 %  Comprehensive metabolic panel  Result Value Ref Range   Sodium 141 135 - 145 mEq/L   Potassium 3.8 3.5 - 5.1 mEq/L   Chloride 101 96 - 112 mEq/L   CO2 30 19 - 32 mEq/L   Glucose, Bld 102 (H) 70 - 99 mg/dL   BUN 15 6 - 23 mg/dL   Creatinine, Ser 0.93 0.40 - 1.50 mg/dL   Total Bilirubin 0.6 0.2 - 1.2 mg/dL   Alkaline Phosphatase 77 39 - 117 U/L   AST 16 0 - 37 U/L   ALT 21 0 - 53 U/L   Total Protein 7.3 6.0 - 8.3 g/dL   Albumin 4.3 3.5 - 5.2 g/dL   GFR 85.73 >60.00 mL/min   Calcium 9.4 8.4 - 10.5 mg/dL  Lipid panel  Result Value Ref Range   Cholesterol 185 0 - 200 mg/dL   Triglycerides 161.0 (H) 0.0 - 149.0 mg/dL   HDL 71.00 >39.00 mg/dL   VLDL 32.2 0.0 - 40.0 mg/dL   LDL Cholesterol 82 0 - 99 mg/dL   Total CHOL/HDL Ratio 3    NonHDL 114.33    Assessment & Plan:  This visit occurred during the SARS-CoV-2 public health emergency.  Safety protocols were in place, including screening questions prior to the visit, additional usage of staff PPE, and extensive cleaning of exam room while observing appropriate contact time as indicated for disinfecting solutions.   Problem List Items Addressed This Visit    Severe obesity (BMI >= 40) (HCC)   Essential hypertension - Primary    Chronic, stable  on losartan (recent transition from amlodipine - continue current regimen.       Relevant Medications   aspirin 81 MG EC tablet   Other  Relevant Orders   Basic metabolic panel   History of pulmonary embolism   History of DVT (deep vein thrombosis)    rec restart aspirin 81mg  daily.       Pedal edema    Chronic, likely multifactorial.  + stemmer sign suggests lymphedema. Difficult to palpate pedal pulses due to dorsal foot swelling.  Will retrial compression stockings and if unable to tolerate will further eval with ABIs.       Lymphedema       Meds ordered this encounter  Medications  . aspirin 81 MG EC tablet    Sig: Take 1 tablet (81 mg total) by mouth daily. Swallow whole.  . Omega-3 Fatty Acids (FISH OIL) 1000 MG CAPS    Sig: Take 1 capsule (1,000 mg total) by mouth daily.   Orders Placed This Encounter  Procedures  . Basic metabolic panel   Patient Instructions  Blood pressure is doing ok. Labs today  I think you have lymphedema in addition to leg swelling. Trial compression stockings - prescription provided today.  Return in 5-6 months for follow up visit.    Follow up plan: Return in about 6 months (around 10/22/2021) for follow up visit.  Ria Bush, MD

## 2021-04-21 NOTE — Assessment & Plan Note (Signed)
Chronic, stable on losartan (recent transition from amlodipine - continue current regimen.

## 2021-04-21 NOTE — Assessment & Plan Note (Signed)
rec restart aspirin 81mg  daily.

## 2021-09-06 DIAGNOSIS — M25561 Pain in right knee: Secondary | ICD-10-CM | POA: Diagnosis not present

## 2021-09-07 DIAGNOSIS — M1612 Unilateral primary osteoarthritis, left hip: Secondary | ICD-10-CM | POA: Diagnosis not present

## 2022-02-07 DIAGNOSIS — M25561 Pain in right knee: Secondary | ICD-10-CM | POA: Diagnosis not present

## 2022-02-07 DIAGNOSIS — M160 Bilateral primary osteoarthritis of hip: Secondary | ICD-10-CM | POA: Diagnosis not present

## 2022-02-09 DIAGNOSIS — M1612 Unilateral primary osteoarthritis, left hip: Secondary | ICD-10-CM | POA: Diagnosis not present

## 2022-03-01 ENCOUNTER — Other Ambulatory Visit: Payer: Self-pay | Admitting: Family Medicine

## 2022-03-15 ENCOUNTER — Other Ambulatory Visit: Payer: Self-pay | Admitting: Family Medicine

## 2022-03-15 DIAGNOSIS — E785 Hyperlipidemia, unspecified: Secondary | ICD-10-CM

## 2022-03-15 DIAGNOSIS — M109 Gout, unspecified: Secondary | ICD-10-CM

## 2022-03-15 DIAGNOSIS — R7303 Prediabetes: Secondary | ICD-10-CM

## 2022-03-15 DIAGNOSIS — N401 Enlarged prostate with lower urinary tract symptoms: Secondary | ICD-10-CM

## 2022-03-19 ENCOUNTER — Other Ambulatory Visit (INDEPENDENT_AMBULATORY_CARE_PROVIDER_SITE_OTHER): Payer: Medicare Other

## 2022-03-19 DIAGNOSIS — R351 Nocturia: Secondary | ICD-10-CM

## 2022-03-19 DIAGNOSIS — R7303 Prediabetes: Secondary | ICD-10-CM | POA: Diagnosis not present

## 2022-03-19 DIAGNOSIS — M109 Gout, unspecified: Secondary | ICD-10-CM | POA: Diagnosis not present

## 2022-03-19 DIAGNOSIS — N401 Enlarged prostate with lower urinary tract symptoms: Secondary | ICD-10-CM

## 2022-03-19 DIAGNOSIS — I10 Essential (primary) hypertension: Secondary | ICD-10-CM | POA: Diagnosis not present

## 2022-03-19 DIAGNOSIS — E785 Hyperlipidemia, unspecified: Secondary | ICD-10-CM

## 2022-03-19 LAB — COMPREHENSIVE METABOLIC PANEL
ALT: 13 U/L (ref 0–53)
AST: 13 U/L (ref 0–37)
Albumin: 4 g/dL (ref 3.5–5.2)
Alkaline Phosphatase: 58 U/L (ref 39–117)
BUN: 18 mg/dL (ref 6–23)
CO2: 32 mEq/L (ref 19–32)
Calcium: 9 mg/dL (ref 8.4–10.5)
Chloride: 102 mEq/L (ref 96–112)
Creatinine, Ser: 1.15 mg/dL (ref 0.40–1.50)
GFR: 65.97 mL/min (ref >60.00)
Glucose, Bld: 103 mg/dL — ABNORMAL HIGH (ref 70–99)
Potassium: 4.3 mEq/L (ref 3.5–5.1)
Sodium: 141 mEq/L (ref 135–145)
Total Bilirubin: 0.5 mg/dL (ref 0.2–1.2)
Total Protein: 6.5 g/dL (ref 6.0–8.3)

## 2022-03-19 LAB — LIPID PANEL
Cholesterol: 188 mg/dL (ref 0–200)
HDL: 85.7 mg/dL (ref >39.00)
LDL Cholesterol: 80 mg/dL (ref 0–99)
NonHDL: 101.84
Total CHOL/HDL Ratio: 2
Triglycerides: 107 mg/dL (ref 0.0–149.0)
VLDL: 21.4 mg/dL (ref 0.0–40.0)

## 2022-03-19 LAB — BASIC METABOLIC PANEL
BUN: 18 mg/dL (ref 6–23)
CO2: 32 mEq/L (ref 19–32)
Calcium: 9 mg/dL (ref 8.4–10.5)
Chloride: 102 mEq/L (ref 96–112)
Creatinine, Ser: 1.15 mg/dL (ref 0.40–1.50)
GFR: 65.97 mL/min (ref >60.00)
Glucose, Bld: 103 mg/dL — ABNORMAL HIGH (ref 70–99)
Potassium: 4.3 mEq/L (ref 3.5–5.1)
Sodium: 141 mEq/L (ref 135–145)

## 2022-03-19 LAB — PSA: PSA: 0.63 ng/mL (ref 0.10–4.00)

## 2022-03-19 LAB — HEMOGLOBIN A1C: Hgb A1c MFr Bld: 6.1 % (ref 4.6–6.5)

## 2022-03-19 LAB — URIC ACID: Uric Acid, Serum: 7.7 mg/dL (ref 4.0–7.8)

## 2022-03-23 ENCOUNTER — Ambulatory Visit (INDEPENDENT_AMBULATORY_CARE_PROVIDER_SITE_OTHER): Payer: Medicare Other

## 2022-03-23 VITALS — Ht 70.0 in | Wt 339.0 lb

## 2022-03-23 DIAGNOSIS — Z0000 Encounter for general adult medical examination without abnormal findings: Secondary | ICD-10-CM

## 2022-03-23 DIAGNOSIS — Z1211 Encounter for screening for malignant neoplasm of colon: Secondary | ICD-10-CM

## 2022-03-23 NOTE — Patient Instructions (Addendum)
?[name] , ?Thank you for taking time to come for your Medicare Wellness Visit. I appreciate your ongoing commitment to your health goals. Please review the following plan we discussed and let me know if I can assist you in the future.  ? ?These are the goals we discussed: ? Goals   ? ?   Weight (lb) < 200 lb (90.7 kg) (pt-stated)   ?   I would like to lose weight to have hip surgery. ?  ? ?  ?  ?This is a list of the screening recommended for you and due dates:  ?Health Maintenance  ?Topic Date Due  ? Colon Cancer Screening  07/12/2020  ? COVID-19 Vaccine (3 - Booster for Pfizer series) 04/08/2022*  ? Zoster (Shingles) Vaccine (1 of 2) 06/22/2022*  ? Tetanus Vaccine  03/24/2023*  ? Flu Shot  06/26/2022  ? Pneumonia Vaccine  Completed  ? Hepatitis C Screening: USPSTF Recommendation to screen - Ages 53-79 yo.  Completed  ? HPV Vaccine  Aged Out  ?*Topic was postponed. The date shown is not the original due date.  ? ?Advanced directives: No Patient deferred ? ?Conditions/risks identified: None ? ?Next appointment: Follow up in one year for your annual wellness visit.  ? ?Preventive Care 105 Years and Older, Male ?Preventive care refers to lifestyle choices and visits with your health care provider that can promote health and wellness. ?What does preventive care include? ?A yearly physical exam. This is also called an annual well check. ?Dental exams once or twice a year. ?Routine eye exams. Ask your health care provider how often you should have your eyes checked. ?Personal lifestyle choices, including: ?Daily care of your teeth and gums. ?Regular physical activity. ?Eating a healthy diet. ?Avoiding tobacco and drug use. ?Limiting alcohol use. ?Practicing safe sex. ?Taking low doses of aspirin every day. ?Taking vitamin and mineral supplements as recommended by your health care provider. ?What happens during an annual well check? ?The services and screenings done by your health care provider during your annual  well check will depend on your age, overall health, lifestyle risk factors, and family history of disease. ?Counseling  ?Your health care provider may ask you questions about your: ?Alcohol use. ?Tobacco use. ?Drug use. ?Emotional well-being. ?Home and relationship well-being. ?Sexual activity. ?Eating habits. ?History of falls. ?Memory and ability to understand (cognition). ?Work and work Statistician. ?Screening  ?You may have the following tests or measurements: ?Height, weight, and BMI. ?Blood pressure. ?Lipid and cholesterol levels. These may be checked every 5 years, or more frequently if you are over 79 years old. ?Skin check. ?Lung cancer screening. You may have this screening every year starting at age 61 if you have a 30-pack-year history of smoking and currently smoke or have quit within the past 15 years. ?Fecal occult blood test (FOBT) of the stool. You may have this test every year starting at age 64. ?Flexible sigmoidoscopy or colonoscopy. You may have a sigmoidoscopy every 5 years or a colonoscopy every 10 years starting at age 80. ?Prostate cancer screening. Recommendations will vary depending on your family history and other risks. ?Hepatitis C blood test. ?Hepatitis B blood test. ?Sexually transmitted disease (STD) testing. ?Diabetes screening. This is done by checking your blood sugar (glucose) after you have not eaten for a while (fasting). You may have this done every 1-3 years. ?Abdominal aortic aneurysm (AAA) screening. You may need this if you are a current or former smoker. ?Osteoporosis. You may be screened starting at  age 35 if you are at high risk. ?Talk with your health care provider about your test results, treatment options, and if necessary, the need for more tests. ?Vaccines  ?Your health care provider may recommend certain vaccines, such as: ?Influenza vaccine. This is recommended every year. ?Tetanus, diphtheria, and acellular pertussis (Tdap, Td) vaccine. You may need a Td booster  every 10 years. ?Zoster vaccine. You may need this after age 90. ?Pneumococcal 13-valent conjugate (PCV13) vaccine. One dose is recommended after age 35. ?Pneumococcal polysaccharide (PPSV23) vaccine. One dose is recommended after age 33. ?Talk to your health care provider about which screenings and vaccines you need and how often you need them. ?This information is not intended to replace advice given to you by your health care provider. Make sure you discuss any questions you have with your health care provider. ?Document Released: 12/09/2015 Document Revised: 08/01/2016 Document Reviewed: 09/13/2015 ?Elsevier Interactive Patient Education ? 2017 [place]. ? ?Fall Prevention in the Home ?Falls can cause injuries. They can happen to people of all ages. There are many things you can do to make your home safe and to help prevent falls. ?What can I do on the outside of my home? ?Regularly fix the edges of walkways and driveways and fix any cracks. ?Remove anything that might make you trip as you walk through a door, such as a raised step or threshold. ?Trim any bushes or trees on the path to your home. ?Use bright outdoor lighting. ?Clear any walking paths of anything that might make someone trip, such as rocks or tools. ?Regularly check to see if handrails are loose or broken. Make sure that both sides of any steps have handrails. ?Any raised decks and porches should have guardrails on the edges. ?Have any leaves, snow, or ice cleared regularly. ?Use sand or salt on walking paths during winter. ?Clean up any spills in your garage right away. This includes oil or grease spills. ?What can I do in the bathroom? ?Use night lights. ?Install grab bars by the toilet and in the tub and shower. Do not use towel bars as grab bars. ?Use non-skid mats or decals in the tub or shower. ?If you need to sit down in the shower, use a plastic, non-slip stool. ?Keep the floor dry. Clean up any water that spills on the floor as soon  as it happens. ?Remove soap buildup in the tub or shower regularly. ?Attach bath mats securely with double-sided non-slip rug tape. ?Do not have throw rugs and other things on the floor that can make you trip. ?What can I do in the bedroom? ?Use night lights. ?Make sure that you have a light by your bed that is easy to reach. ?Do not use any sheets or blankets that are too big for your bed. They should not hang down onto the floor. ?Have a firm chair that has side arms. You can use this for support while you get dressed. ?Do not have throw rugs and other things on the floor that can make you trip. ?What can I do in the kitchen? ?Clean up any spills right away. ?Avoid walking on wet floors. ?Keep items that you use a lot in easy-to-reach places. ?If you need to reach something above you, use a strong step stool that has a grab bar. ?Keep electrical cords out of the way. ?Do not use floor polish or wax that makes floors slippery. If you must use wax, use non-skid floor wax. ?Do not have throw rugs and  other things on the floor that can make you trip. ?What can I do with my stairs? ?Do not leave any items on the stairs. ?Make sure that there are handrails on both sides of the stairs and use them. Fix handrails that are broken or loose. Make sure that handrails are as long as the stairways. ?Check any carpeting to make sure that it is firmly attached to the stairs. Fix any carpet that is loose or worn. ?Avoid having throw rugs at the top or bottom of the stairs. If you do have throw rugs, attach them to the floor with carpet tape. ?Make sure that you have a light switch at the top of the stairs and the bottom of the stairs. If you do not have them, ask someone to add them for you. ?What else can I do to help prevent falls? ?Wear shoes that: ?Do not have high heels. ?Have rubber bottoms. ?Are comfortable and fit you well. ?Are closed at the toe. Do not wear sandals. ?If you use a stepladder: ?Make sure that it is fully  opened. Do not climb a closed stepladder. ?Make sure that both sides of the stepladder are locked into place. ?Ask someone to hold it for you, if possible. ?Clearly mark and make sure that you can see: ?

## 2022-03-23 NOTE — Progress Notes (Signed)
? ?Subjective:  ? Philip Middleton is a 66 y.o. male who presents for Medicare Annual/Subsequent preventive examination. ? ?Review of Systems    ?Virtual Visit via Telephone Note ? ?I connected with  Philip Middleton on 03/23/22 at 10:30 AM EDT by telephone and verified that I am speaking with the correct person using two identifiers. ? ?Location: ?Patient: Home ?Provider: Office ?Persons participating in the virtual visit: patient/Nurse Health Advisor ?  ?I discussed the limitations, risks, security and privacy concerns of performing an evaluation and management service by telephone and the availability of in person appointments. The patient expressed understanding and agreed to proceed. ? ?Interactive audio and video telecommunications were attempted between this nurse and patient, however failed, due to patient having technical difficulties OR patient did not have access to video capability.  We continued and completed visit with audio only. ? ?Some vital signs may be absent or patient reported.  ? ?[name], LPN  ?Cardiac Risk Factors include: advanced age (>86mn, >>26women);hypertension;male gender ? ?   ?Objective:  ?  ?Today's Vitals  ? 03/23/22 1036  ?Weight: (!) 339 lb (153.8 kg)  ?Height: '5\' 10"'$  (1.778 m)  ? ?Body mass index is 48.64 kg/m?. ? ? ?  03/23/2022  ? 10:50 AM 06/28/2017  ?  2:04 PM  ?Advanced Directives  ?Does Patient Have a Medical Advance Directive? No No  ?Would patient like information on creating a medical advance directive? No - Patient declined   ? ? ?Current Medications (verified) ?Outpatient Encounter Medications as of 03/23/2022  ?Medication Sig  ? aspirin 81 MG EC tablet Take 1 tablet (81 mg total) by mouth daily. Swallow whole.  ? atorvastatin (LIPITOR) 40 MG tablet TAKE 1 TABLET BY MOUTH DAILY AT 6 PM.  ? losartan (COZAAR) 50 MG tablet TAKE 1 TABLET BY MOUTH EVERY DAY  ? Multiple Vitamin (MULTIVITAMIN) tablet Take 1 tablet by mouth daily.  ? naproxen sodium (ALEVE) 220 MG  tablet Take 2 tablets (440 mg total) by mouth daily as needed.  ? Omega-3 Fatty Acids (FISH OIL) 1000 MG CAPS Take 1 capsule (1,000 mg total) by mouth daily.  ? sildenafil (VIAGRA) 100 MG tablet Take 0.5-1 tablets (50-100 mg total) by mouth daily as needed for erectile dysfunction.  ? ?No facility-administered encounter medications on file as of 03/23/2022.  ? ? ?Allergies (verified) ?Patient has no known allergies.  ? ?History: ?Past Medical History:  ?Diagnosis Date  ? 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 Hyperlipemia   ? Hypertension   ? left thumb ORIF   ? fracture  ? Peri-rectal abscess   ? repair x 4  ? Personal history of colonic adenomas   ? ?Past Surgical History:  ?Procedure Laterality Date  ? COLONOSCOPY  10/2013  ? 4 diminutive polyps, rpt 3 yrs (Philip Middleton  ? COLONOSCOPY  06/2017  ? 3 TA, rpt 3 yrs (Philip Middleton  ? DENTAL RESTORATION/EXTRACTION WITH X-RAY    ? LE venous U/S nml  03/29/09  ? L residual thrombus in SFV and poplitael veins  ? LE venous U/S nml  10/2009  ? no superficial or deep thrombus  ? ORIF FINGER / THUMB FRACTURE    ? ?Family History  ?Problem Relation Age of Onset  ? Cancer Mother   ?     metastatic cancer, ? ovarian  ? Sleep apnea Brother   ? Diabetes Sister   ? Cancer Maternal Uncle [id number] ?  prostate  ? Colon cancer Neg Hx   ? ?Social History  ? ?Socioeconomic History  ? Marital status: Single  ?  Spouse name: Not on file  ? Number of children: Not on file  ? Years of education: Not on file  ? Highest education level: Not on file  ?Occupational History  ? Not on file  ?Tobacco Use  ? Smoking status: Former  ?  Types: Cigars  ? Smokeless tobacco: Never  ? Tobacco comments:  ?  smoked 1-2 daily, quit 2005  ?Substance and Sexual Activity  ? Alcohol use: Yes  ?  Alcohol/week: 0.0 standard drinks  ?  Comment: occasionally  ? Drug use: No  ? Sexual activity: Never  ?Other Topics Concern  ? Not on file  ?Social History Narrative  ? Caffeine:  2-3 cups coffee/day  ? Lives alone, no pets, 1 child  ? Occupation: Proofreader, warehouse  ? Activity: stays active at work, no regular exercise  ? Diet: good water, daily fruits/vegetables   ? ?Social Determinants of Health  ? ?Financial Resource Strain: Low Risk   ? Difficulty of Paying Living Expenses: Not hard at all  ?Food Insecurity: No Food Insecurity  ? Worried About Charity fundraiser in the Last Year: Never true  ? Ran Out of Food in the Last Year: Never true  ?Transportation Needs: No Transportation Needs  ? Lack of Transportation (Medical): No  ? Lack of Transportation (Non-Medical): No  ?Physical Activity: Inactive  ? Days of Exercise per Week: 0 days  ? Minutes of Exercise per Session: 0 min  ?Stress: No Stress Concern Present  ? Feeling of Stress : Not at all  ?Social Connections: Moderately Integrated  ? Frequency of Communication with Friends and Family: More than three times a week  ? Frequency of Social Gatherings with Friends and Family: More than three times a week  ? Attends Religious Services: More than 4 times per year  ? Active Member of Clubs or Organizations: Yes  ? Attends Archivist Meetings: More than 4 times per year  ? Marital Status: Divorced  ? ? ?Tobacco Counseling ?Counseling given: Not Answered ?Tobacco comments: smoked 1-2 daily, quit 2005 ? ? ?Clinical Intake: ? ?Diabetic?  No ? ? ? ?Activities of Daily Living ? ?  03/23/2022  ? 10:47 AM  ?In your present state of health, do you have any difficulty performing the following activities:  ?Hearing? 0  ?Vision? 0  ?Difficulty concentrating or making decisions? 0  ?Walking or climbing stairs? 0  ?Dressing or bathing? 0  ?Doing errands, shopping? 0  ?Preparing Food and eating ? N  ?Using the Toilet? N  ?In the past six months, have you accidently leaked urine? N  ?Do you have problems with loss of bowel control? N  ?Managing your Medications? N  ?Managing your Finances? N  ?Housekeeping or managing your Housekeeping? N   ? ? ?Patient Care Team: ?Ria Bush, MD as PCP - General (Family Medicine) ? ?Indicate any recent Medical Services you may have received from other than [facility] providers in the past year (date may be approximate). ? ?   ?Assessment:  ? This is a routine wellness examination for [place]. ? ?Hearing/Vision screen ?Hearing Screening - Comments:: No difficulty hearing ?Vision Screening - Comments:: Wears glasses. Followed by [facility] ? ?Dietary issues and exercise activities discussed: ?Exercise limited by: None identified ? ? Goals Addressed   ? ?  ?  ?  ?  ?  ?  This Visit's Progress  ?   Weight (lb) < 200 lb (90.7 kg) (pt-stated)   339 lb (153.8 kg)  ?   I would like to lose weight to have hip surgery. ?  ? ?  ? ?Depression Screen ? ?  03/23/2022  ? 10:45 AM 03/21/2021  ?  2:30 PM 09/12/2018  ?  2:01 PM 08/27/2017  ?  8:29 AM  ?PHQ 2/9 Scores  ?PHQ - 2 Score 0 0 0 1  ?  ?Fall Risk ? ?  03/23/2022  ? 10:48 AM 03/21/2021  ?  2:30 PM  ?Fall Risk   ?Falls in the past year? 0 0  ?Number falls in past yr: 0   ?Injury with Fall? 0   ?Risk for fall due to : No Fall Risks   ? ? ?FALL RISK PREVENTION PERTAINING TO THE HOME: ? ?Any stairs in or around the home? No  ?If so, are there any without handrails? No  ?Home free of loose throw rugs in walkways, pet beds, electrical cords, etc? Yes  ?Adequate lighting in your home to reduce risk of falls? Yes  ? ?ASSISTIVE DEVICES UTILIZED TO PREVENT FALLS: ? ?Life alert? No  ?Use of a cane, walker or w/c? Yes  ?Grab bars in the bathroom? No  ?Shower chair or bench in shower? Yes  ?Elevated toilet seat or a handicapped toilet? No  ? ?TIMED UP AND GO: ? ?Was the test performed? No . Audio Visit ? ?Cognitive Function: ?   ? ?  03/23/2022  ? 10:50 AM  ?6CIT Screen  ?What Year? 0 points  ?What month? 0 points  ?What time? 0 points  ?Count back from 20 0 points  ?Months in reverse 0 points  ?Repeat phrase 0 points  ?Total Score 0 points  ? ? ?Immunizations ?Immunization History   ?Administered Date(s) Administered  ? Influenza, High Dose Seasonal PF 10/05/2021  ? Influenza-Unspecified 08/26/2014, 09/09/2017  ? PFIZER(Purple Top)SARS-COV-2 Vaccination 04/26/2020, 05/20/2020  ? PNEUMOCOCCAL CONJUG

## 2022-03-26 ENCOUNTER — Ambulatory Visit (INDEPENDENT_AMBULATORY_CARE_PROVIDER_SITE_OTHER): Payer: Medicare Other | Admitting: Family Medicine

## 2022-03-26 ENCOUNTER — Encounter: Payer: Self-pay | Admitting: Family Medicine

## 2022-03-26 VITALS — BP 160/92 | HR 73 | Temp 98.3°F | Ht 70.0 in | Wt 337.5 lb

## 2022-03-26 DIAGNOSIS — Z86010 Personal history of colonic polyps: Secondary | ICD-10-CM

## 2022-03-26 DIAGNOSIS — I890 Lymphedema, not elsewhere classified: Secondary | ICD-10-CM

## 2022-03-26 DIAGNOSIS — Z86711 Personal history of pulmonary embolism: Secondary | ICD-10-CM | POA: Diagnosis not present

## 2022-03-26 DIAGNOSIS — R351 Nocturia: Secondary | ICD-10-CM

## 2022-03-26 DIAGNOSIS — R7303 Prediabetes: Secondary | ICD-10-CM

## 2022-03-26 DIAGNOSIS — E785 Hyperlipidemia, unspecified: Secondary | ICD-10-CM | POA: Diagnosis not present

## 2022-03-26 DIAGNOSIS — R600 Localized edema: Secondary | ICD-10-CM | POA: Diagnosis not present

## 2022-03-26 DIAGNOSIS — Z0000 Encounter for general adult medical examination without abnormal findings: Secondary | ICD-10-CM

## 2022-03-26 DIAGNOSIS — I10 Essential (primary) hypertension: Secondary | ICD-10-CM

## 2022-03-26 DIAGNOSIS — Z86718 Personal history of other venous thrombosis and embolism: Secondary | ICD-10-CM

## 2022-03-26 DIAGNOSIS — N401 Enlarged prostate with lower urinary tract symptoms: Secondary | ICD-10-CM

## 2022-03-26 MED ORDER — ATORVASTATIN CALCIUM 40 MG PO TABS: ORAL_TABLET | ORAL | 3 refills | Status: DC

## 2022-03-26 MED ORDER — LOSARTAN POTASSIUM 100 MG PO TABS: 100.0000 mg | ORAL_TABLET | Freq: Every day | ORAL | 3 refills | Status: DC

## 2022-03-26 NOTE — Assessment & Plan Note (Signed)
Encourage healthy diet and lifestyle choices to affect sustainable weight loss.  

## 2022-03-26 NOTE — Assessment & Plan Note (Signed)
PSA stable

## 2022-03-26 NOTE — Assessment & Plan Note (Signed)
Chronic, uncontrolled - will increase losartan to '100mg'$  daily and reassess at 60mof/u visit.  ?

## 2022-03-26 NOTE — Patient Instructions (Addendum)
You may call [facility] to schedule an appointment at [phone].   ?If interested, check with pharmacy about new 2 shot shingles series (shingrix).  ?Blood pressure was too high - increase losartan to '100mg'$  daily - take 2 '50mg'$  tablets until you run out, new dose will be '100mg'$  tablet.  ?Try gas-X over the counter for gassiness.  ?Return in 3 months for follow up visit on blood pressure and leg swelling  ? ?Health Maintenance After Age 51 ?After age 64, you are at a higher risk for certain long-term diseases and infections as well as injuries from falls. Falls are a major cause of broken bones and head injuries in people who are older than age 11. Getting regular preventive care can help to keep you healthy and well. Preventive care includes getting regular testing and making lifestyle changes as recommended by your health care provider. Talk with your health care provider about: ?Which screenings and tests you should have. A screening is a test that checks for a disease when you have no symptoms. ?A diet and exercise plan that is right for you. ?What should I know about screenings and tests to prevent falls? ?Screening and testing are the best ways to find a health problem early. Early diagnosis and treatment give you the best chance of managing medical conditions that are common after age 79. Certain conditions and lifestyle choices may make you more likely to have a fall. Your health care provider may recommend: ?Regular vision checks. Poor vision and conditions such as cataracts can make you more likely to have a fall. If you wear glasses, make sure to get your prescription updated if your vision changes. ?Medicine review. Work with your health care provider to regularly review all of the medicines you are taking, including over-the-counter medicines. Ask your health care provider about any side effects that may make you more likely to have a fall. Tell your health care provider if any medicines that you take  make you feel dizzy or sleepy. ?Strength and balance checks. Your health care provider may recommend certain tests to check your strength and balance while standing, walking, or changing positions. ?Foot health exam. Foot pain and numbness, as well as not wearing proper footwear, can make you more likely to have a fall. ?Screenings, including: ?Osteoporosis screening. Osteoporosis is a condition that causes the bones to get weaker and break more easily. ?Blood pressure screening. Blood pressure changes and medicines to control blood pressure can make you feel dizzy. ?Depression screening. You may be more likely to have a fall if you have a fear of falling, feel depressed, or feel unable to do activities that you used to do. ?Alcohol use screening. Using too much alcohol can affect your balance and may make you more likely to have a fall. ?Follow these instructions at home: ?Lifestyle ?Do not drink alcohol if: ?Your health care provider tells you not to drink. ?If you drink alcohol: ?Limit how much you have to: ?0-1 drink a day for women. ?0-2 drinks a day for men. ?Know how much alcohol is in your drink. In the [place] one drink equals one 12 oz bottle of beer (355 mL), one 5 oz glass of wine (148 mL), or one 1? oz glass of hard liquor (44 mL). ?Do not use any products that contain nicotine or tobacco. These products include cigarettes, chewing tobacco, and vaping devices, such as e-cigarettes. If you need help quitting, ask your health care provider. ?Activity ? ?Follow a regular  exercise program to stay fit. This will help you maintain your balance. Ask your health care provider what types of exercise are appropriate for you. ?If you need a cane or walker, use it as recommended by your health care provider. ?Wear supportive shoes that have nonskid soles. ?Safety ? ?Remove any tripping hazards, such as rugs, cords, and clutter. ?Install safety equipment such as grab bars in bathrooms and safety rails on stairs. ?Keep  rooms and walkways well-lit. ?General instructions ?Talk with your health care provider about your risks for falling. Tell your health care provider if: ?You fall. Be sure to tell your health care provider about all falls, even ones that seem minor. ?You feel dizzy, tiredness (fatigue), or off-balance. ?Take over-the-counter and prescription medicines only as told by your health care provider. These include supplements. ?Eat a healthy diet and maintain a healthy weight. A healthy diet includes low-fat dairy products, low-fat (lean) meats, and fiber from whole grains, beans, and lots of fruits and vegetables. ?Stay current with your vaccines. ?Schedule regular health, dental, and eye exams. ?Summary ?Having a healthy lifestyle and getting preventive care can help to protect your health and wellness after age 55. ?Screening and testing are the best way to find a health problem early and help you avoid having a fall. Early diagnosis and treatment give you the best chance for managing medical conditions that are more common for people who are older than age 95. ?Falls are a major cause of broken bones and head injuries in people who are older than age 28. Take precautions to prevent a fall at home. ?Work with your health care provider to learn what changes you can make to improve your health and wellness and to prevent falls. ?This information is not intended to replace advice given to you by your health care provider. Make sure you discuss any questions you have with your health care provider. ?Document Revised: 04/03/2021 Document Reviewed: 04/03/2021 ?Elsevier Patient Education ? [place]. ? ?

## 2022-03-26 NOTE — Assessment & Plan Note (Signed)
Preventative protocols reviewed and updated unless pt declined. Discussed healthy diet and lifestyle.  

## 2022-03-26 NOTE — Assessment & Plan Note (Deleted)
Advanced directives - doesn't have set up. Unsure who HCPOA would be.  ?

## 2022-03-26 NOTE — Assessment & Plan Note (Signed)
Reviewed with patient - avoid added sugars/carbs ?

## 2022-03-26 NOTE — Assessment & Plan Note (Signed)
Continue aspirin daily.  

## 2022-03-26 NOTE — Progress Notes (Signed)
? ? Patient ID: Philip Middleton, male    DOB: [birth year], 66 y.o.   MRN: [id number] ? ?This visit was conducted in person. ? ?BP (!) 160/92   Pulse 73   Temp 98.3 ?F (36.8 ?C) (Temporal)   Ht '5\' 10"'$  (1.778 m)   Wt (!) 337 lb 8 oz (153.1 kg)   SpO2 97%   BMI 48.43 kg/m?   ?160/100 on repeat testing ? ?CC: AMW ?Subjective:  ? ?HPI: ?Philip CAPPIELLO is a 66 y.o. male presenting on 03/26/2022 for Medicare Wellness ? ? ?Saw health advisor last week for medicare wellness visit. Note reviewed.   ? ?Hearing Screening  ? '250Hz'$  '500Hz'$  '1000Hz'$  '2000Hz'$  '4000Hz'$   ?Right ear '20 25 20 20 20  '$ ?Left ear 20 0 '25 20 20  '$ ? ?Vision Screening  ? Right eye Left eye Both eyes  ?Without correction     ?With correction '20/20 20/15 20/15 '$  ?  ?Flowsheet Row Clinical Support from 03/23/2022 in [facility]  ?PHQ-2 Total Score 0  ? ?  ?  ? ?  03/23/2022  ? 10:48 AM 03/21/2021  ?  2:30 PM  ?Fall Risk   ?Falls in the past year? 0 0  ?Number falls in past yr: 0   ?Injury with Fall? 0   ?Risk for fall due to : No Fall Risks   ? ? ?Preventative: ?Colonoscopy 06/2017 3 TA, rpt 3 yrs [name]) - due for this ?Prostate screening - uncle with prostate cancer. Checks PSA yearly. DRE reassuring 02/2018.  ?Lung cancer screening - not eligible  ?Flu shot yearly  ?[place] 04/2020 x2 , no booster ?Td 2012  ?Prevnar-20 02/2021 ?Shingrix - discussed.  ?Advanced directives - doesn't have set up. Unsure who HCPOA would be. Will need to readdress.  ?Seat belt use discussed ?Sunscreen use discussed. No changing moles on skin ?Non smoker ?Alcohol - socially - not daily, max 4-5 drinks/day  ?Rec drugs - none ?Dentist Q6 mo - due  ?Eye exam yearly - due ?Bowel - no constipation ?Bladder - no incontinence ? ?Caffeine: 2-3 cups coffee/day   ?Lives alone, no pets, 1 child   ?Occupation: Proofreader, warehouse   ?Activity: no regular exercise  ?Diet: good water, daily fruits/vegetables  ?   ? ?Relevant past medical, surgical, family and social  history reviewed and updated as indicated. Interim medical history since our last visit reviewed. ?Allergies and medications reviewed and updated. ?Outpatient Medications Prior to Visit  ?Medication Sig Dispense Refill  ? aspirin 81 MG EC tablet Take 1 tablet (81 mg total) by mouth daily. Swallow whole.    ? Multiple Vitamin (MULTIVITAMIN) tablet Take 1 tablet by mouth daily.    ? naproxen sodium (ALEVE) 220 MG tablet Take 2 tablets (440 mg total) by mouth daily as needed.    ? Omega-3 Fatty Acids (FISH OIL) 1000 MG CAPS Take 1 capsule (1,000 mg total) by mouth daily.    ? sildenafil (VIAGRA) 100 MG tablet Take 0.5-1 tablets (50-100 mg total) by mouth daily as needed for erectile dysfunction. 5 tablet 11  ? atorvastatin (LIPITOR) 40 MG tablet TAKE 1 TABLET BY MOUTH DAILY AT 6 PM. 90 tablet 0  ? losartan (COZAAR) 50 MG tablet TAKE 1 TABLET BY MOUTH EVERY DAY 90 tablet 0  ? ?No facility-administered medications prior to visit.  ?  ? ?Per HPI unless specifically indicated in ROS section below ?Review of Systems  ?Constitutional:  Negative for activity change,  appetite change, chills, fatigue, fever and unexpected weight change.  ?HENT:  Negative for hearing loss.   ?Eyes:  Negative for visual disturbance.  ?Respiratory:  Positive for shortness of breath (chronic from h/o PE). Negative for cough, chest tightness and wheezing.   ?Cardiovascular:  Positive for leg swelling (bilateral after DVTs). Negative for chest pain and palpitations.  ?Gastrointestinal:  Negative for abdominal distention, abdominal pain, blood in stool, constipation, diarrhea, nausea and vomiting.  ?     Collects gas left upper abdomen - tums helps  ?Genitourinary:  Negative for difficulty urinating and hematuria.  ?Musculoskeletal:  Negative for arthralgias, myalgias and neck pain.  ?Skin:  Negative for rash.  ?Neurological:  Negative for dizziness, seizures, syncope and headaches.  ?Hematological:  Negative for adenopathy. Does not bruise/bleed  easily.  ?Psychiatric/Behavioral:  Negative for dysphoric mood. The patient is not nervous/anxious.   ? ?Objective:  ?BP (!) 160/92   Pulse 73   Temp 98.3 ?F (36.8 ?C) (Temporal)   Ht '5\' 10"'$  (1.778 m)   Wt (!) 337 lb 8 oz (153.1 kg)   SpO2 97%   BMI 48.43 kg/m?   ?Wt Readings from Last 3 Encounters:  ?03/26/22 (!) 337 lb 8 oz (153.1 kg)  ?03/23/22 (!) 339 lb (153.8 kg)  ?04/21/21 (!) 339 lb 8 oz (154 kg)  ?  ?  ?Physical Exam ?Vitals and nursing note reviewed.  ?Constitutional:   ?   General: He is not in acute distress. ?   Appearance: Normal appearance. He is well-developed. He is obese. He is not ill-appearing.  ?HENT:  ?   Head: Normocephalic and atraumatic.  ?   Right Ear: Hearing, tympanic membrane, ear canal and external ear normal.  ?   Left Ear: Hearing, tympanic membrane, ear canal and external ear normal.  ?Eyes:  ?   General: No scleral icterus. ?   Extraocular Movements: Extraocular movements intact.  ?   Conjunctiva/sclera: Conjunctivae normal.  ?   Pupils: Pupils are equal, round, and reactive to light.  ?Neck:  ?   Thyroid: No thyroid mass or thyromegaly.  ?   Vascular: No carotid bruit.  ?Cardiovascular:  ?   Rate and Rhythm: Normal rate and regular rhythm.  ?   Pulses: Normal pulses.     ?     Radial pulses are 2+ on the right side and 2+ on the left side.  ?   Heart sounds: Normal heart sounds. No murmur heard. ?Pulmonary:  ?   Effort: Pulmonary effort is normal. No respiratory distress.  ?   Breath sounds: Normal breath sounds. No wheezing, rhonchi or rales.  ?Abdominal:  ?   General: Bowel sounds are normal. There is no distension.  ?   Palpations: Abdomen is soft. There is no mass.  ?   Tenderness: There is no abdominal tenderness. There is no guarding or rebound.  ?   Hernia: No hernia is present.  ?Musculoskeletal:     ?   General: Normal range of motion.  ?   Cervical back: Normal range of motion and neck supple.  ?   Right lower leg: No edema.  ?   Left lower leg: No edema.   ?Lymphadenopathy:  ?   Cervical: No cervical adenopathy.  ?Skin: ?   General: Skin is warm and dry.  ?   Findings: No rash.  ?Neurological:  ?   General: No focal deficit present.  ?   Mental Status: He is alert and oriented to  person, place, and time.  ?Psychiatric:     ?   Mood and Affect: Mood normal.     ?   Behavior: Behavior normal.     ?   Thought Content: Thought content normal.     ?   Judgment: Judgment normal.  ? ?   ?Results for orders placed or performed in visit on 03/19/22  ?Uric acid  ?Result Value Ref Range  ? Uric Acid, Serum 7.7 4.0 - 7.8 mg/dL  ?PSA  ?Result Value Ref Range  ? PSA 0.63 0.10 - 4.00 ng/mL  ?Hemoglobin A1c  ?Result Value Ref Range  ? Hgb A1c MFr Bld 6.1 4.6 - 6.5 %  ?Comprehensive metabolic panel  ?Result Value Ref Range  ? Sodium 141 135 - 145 mEq/L  ? Potassium 4.3 3.5 - 5.1 mEq/L  ? Chloride 102 96 - 112 mEq/L  ? CO2 32 19 - 32 mEq/L  ? Glucose, Bld 103 (H) 70 - 99 mg/dL  ? BUN 18 6 - 23 mg/dL  ? Creatinine, Ser 1.15 0.40 - 1.50 mg/dL  ? Total Bilirubin 0.5 0.2 - 1.2 mg/dL  ? Alkaline Phosphatase 58 39 - 117 U/L  ? AST 13 0 - 37 U/L  ? ALT 13 0 - 53 U/L  ? Total Protein 6.5 6.0 - 8.3 g/dL  ? Albumin 4.0 3.5 - 5.2 g/dL  ? GFR 65.97 >60.00 mL/min  ? Calcium 9.0 8.4 - 10.5 mg/dL  ?Lipid panel  ?Result Value Ref Range  ? Cholesterol 188 0 - 200 mg/dL  ? Triglycerides 107.0 0.0 - 149.0 mg/dL  ? HDL 85.70 >39.00 mg/dL  ? VLDL 21.4 0.0 - 40.0 mg/dL  ? LDL Cholesterol 80 0 - 99 mg/dL  ? Total CHOL/HDL Ratio 2   ? NonHDL 101.84   ?Basic metabolic panel  ?Result Value Ref Range  ? Sodium 141 135 - 145 mEq/L  ? Potassium 4.3 3.5 - 5.1 mEq/L  ? Chloride 102 96 - 112 mEq/L  ? CO2 32 19 - 32 mEq/L  ? Glucose, Bld 103 (H) 70 - 99 mg/dL  ? BUN 18 6 - 23 mg/dL  ? Creatinine, Ser 1.15 0.40 - 1.50 mg/dL  ? GFR 65.97 >60.00 mL/min  ? Calcium 9.0 8.4 - 10.5 mg/dL  ? ?Lab Results  ?Component Value Date  ? PSA 0.63 03/19/2022  ? PSA 0.78 03/14/2021  ? PSA 0.77 09/08/2018  ? ?Assessment & Plan:   ? ?Problem List Items Addressed This Visit   ? ? Healthcare maintenance - Primary (Chronic)  ?  Preventative protocols reviewed and updated unless pt declined. ?Discussed healthy diet and lifestyle.  ? ?  ?  ? HLD (hyperlipidemia)  ?

## 2022-03-26 NOTE — Assessment & Plan Note (Addendum)
With evidence of lymphedema.  ?He has been unable to use compression stockings due to size of legs. Briefly discussed OT evaluation - will further discuss next visit.  ?

## 2022-03-26 NOTE — Assessment & Plan Note (Signed)
Chronic, stable on atorvastatin - continue. ?The 10-year ASCVD risk score (Arnett DK, et al., 2019) is: 21.4% ?  Values used to calculate the score: ?    Age: 66 years ?    Sex: Male ?    Is Non-Hispanic African American: Yes ?    Diabetic: No ?    Tobacco smoker: No ?    Systolic Blood Pressure: 727 mmHg ?    Is BP treated: Yes ?    HDL Cholesterol: 85.7 mg/dL ?    Total Cholesterol: 188 mg/dL  ?

## 2022-03-26 NOTE — Assessment & Plan Note (Signed)
See above

## 2022-03-26 NOTE — Assessment & Plan Note (Signed)
Overdue for colonoscopy - # provided to call and schedule.  ?

## 2022-04-10 ENCOUNTER — Encounter: Payer: Medicare Other | Admitting: Family Medicine

## 2022-05-08 ENCOUNTER — Encounter: Payer: Self-pay | Admitting: Internal Medicine

## 2022-05-31 ENCOUNTER — Ambulatory Visit (AMBULATORY_SURGERY_CENTER): Payer: Self-pay | Admitting: *Deleted

## 2022-05-31 VITALS — Ht 70.0 in | Wt 336.8 lb

## 2022-05-31 DIAGNOSIS — Z86010 Personal history of colonic polyps: Secondary | ICD-10-CM

## 2022-05-31 NOTE — Progress Notes (Signed)
Patient is here in-person for PV. Patient denies any allergies to eggs or soy. Patient denies any problems with anesthesia/sedation. Patient is not on any oxygen at home. Patient is not taking any diet/weight loss medications or blood thinners. Went over procedure prep instructions with the patient. Patient is aware of our care-partner policy. Patient states he has a BM every day.

## 2022-06-07 ENCOUNTER — Other Ambulatory Visit: Payer: Self-pay | Admitting: Family Medicine

## 2022-06-07 NOTE — Telephone Encounter (Signed)
Strength increased to 100 mg daily. New rx sent on 03/26/22, #90/3. (see 03/26/22 OV notes)

## 2022-06-22 ENCOUNTER — Encounter: Payer: Self-pay | Admitting: Internal Medicine

## 2022-06-26 ENCOUNTER — Encounter: Payer: Self-pay | Admitting: Family Medicine

## 2022-06-26 ENCOUNTER — Ambulatory Visit (INDEPENDENT_AMBULATORY_CARE_PROVIDER_SITE_OTHER): Payer: Medicare Other | Admitting: Family Medicine

## 2022-06-26 VITALS — BP 144/90 | HR 74 | Temp 97.6°F | Ht 70.0 in | Wt 338.1 lb

## 2022-06-26 DIAGNOSIS — I890 Lymphedema, not elsewhere classified: Secondary | ICD-10-CM

## 2022-06-26 DIAGNOSIS — R0609 Other forms of dyspnea: Secondary | ICD-10-CM | POA: Diagnosis not present

## 2022-06-26 DIAGNOSIS — Z86718 Personal history of other venous thrombosis and embolism: Secondary | ICD-10-CM

## 2022-06-26 DIAGNOSIS — I10 Essential (primary) hypertension: Secondary | ICD-10-CM | POA: Diagnosis not present

## 2022-06-26 DIAGNOSIS — Z86711 Personal history of pulmonary embolism: Secondary | ICD-10-CM | POA: Diagnosis not present

## 2022-06-26 HISTORY — PX: COLONOSCOPY: SHX174

## 2022-06-26 MED ORDER — LOSARTAN POTASSIUM-HCTZ 100-12.5 MG PO TABS: 1.0000 | ORAL_TABLET | Freq: Every day | ORAL | 2 refills | Status: DC

## 2022-06-26 NOTE — Assessment & Plan Note (Addendum)
Chronic exertional dyspnea since DVT/PE 2009.  Anticipate this along with obesity contribute. Denies significant OSA symptoms. Not consistent with asthma. Non smoker.  Consider updated EKG, echo, lung imaging.

## 2022-06-26 NOTE — Addendum Note (Signed)
Addended by: Ria Bush on: 06/26/2022 10:10 AM   Modules accepted: Orders

## 2022-06-26 NOTE — Progress Notes (Addendum)
Patient ID: Philip Middleton, male    DOB: [birth year], 66 y.o.   MRN: [id number]  This visit was conducted in person.  BP (!) 144/90 (BP Location: Right Arm, Cuff Size: Large)   Pulse 74   Temp 97.6 F (36.4 C) (Temporal)   Ht '5\' 10"'$  (1.778 m)   Wt (!) 338 lb 2 oz (153.4 kg)   SpO2 96%   BMI 48.52 kg/m    140/94 on retesting CC: 3 mo HTN f/u visit  Subjective:   HPI: Philip Middleton is a 66 y.o. male presenting on 06/26/2022 for Hypertension (Here for 3 mo f/u.)   HTN - Compliant with current antihypertensive regimen of losartan '100mg'$  daily (increased dose last visit). Does not check blood pressures at home.  No low blood pressure readings or symptoms of dizziness/syncope.  Denies HA, vision changes, CP/tightness, SOB.    Lymphedema - chronic issue since DVTs with PE 2009. Completed 6 months of coumadin. Stays shortwinded since this - predominantly exertional.  No known snoring or witnessed apnea. No significant daytime somnolence. Endorses restorative sleep.  Lab Results  Component Value Date   TSH 1.08 01/27/2020   No h/o asthma. No smoking history. No wheezing. Occasional cough.   Upcoming colonoscopy.      Relevant past medical, surgical, family and social history reviewed and updated as indicated. Interim medical history since our last visit reviewed. Allergies and medications reviewed and updated. Outpatient Medications Prior to Visit  Medication Sig Dispense Refill   aspirin 81 MG EC tablet Take 1 tablet (81 mg total) by mouth daily. Swallow whole.     atorvastatin (LIPITOR) 40 MG tablet TAKE 1 TABLET BY MOUTH DAILY AT 6 PM. 90 tablet 3   Multiple Vitamin (MULTIVITAMIN) tablet Take 1 tablet by mouth daily.     naproxen sodium (ALEVE) 220 MG tablet Take 2 tablets (440 mg total) by mouth daily as needed.     Omega-3 Fatty Acids (FISH OIL) 1000 MG CAPS Take 1 capsule (1,000 mg total) by mouth daily.     sildenafil (VIAGRA) 100 MG tablet Take 0.5-1 tablets (50-100 mg  total) by mouth daily as needed for erectile dysfunction. 5 tablet 11   losartan (COZAAR) 100 MG tablet Take 1 tablet (100 mg total) by mouth daily. 90 tablet 3   No facility-administered medications prior to visit.     Per HPI unless specifically indicated in ROS section below Review of Systems  Objective:  BP (!) 144/90 (BP Location: Right Arm, Cuff Size: Large)   Pulse 74   Temp 97.6 F (36.4 C) (Temporal)   Ht '5\' 10"'$  (1.778 m)   Wt (!) 338 lb 2 oz (153.4 kg)   SpO2 96%   BMI 48.52 kg/m   Wt Readings from Last 3 Encounters:  06/26/22 (!) 338 lb 2 oz (153.4 kg)  05/31/22 (!) 336 lb 12.8 oz (152.8 kg)  03/26/22 (!) 337 lb 8 oz (153.1 kg)      Physical Exam Vitals and nursing note reviewed.  Constitutional:      Appearance: Normal appearance. He is obese. He is not ill-appearing.  Eyes:     Extraocular Movements: Extraocular movements intact.     Conjunctiva/sclera: Conjunctivae normal.     Pupils: Pupils are equal, round, and reactive to light.  Neck:     Thyroid: No thyroid mass or thyromegaly.  Cardiovascular:     Rate and Rhythm: Normal rate and regular rhythm.     Pulses: Normal  pulses.     Heart sounds: Normal heart sounds. No murmur heard. Pulmonary:     Effort: Pulmonary effort is normal. No respiratory distress.     Breath sounds: Normal breath sounds. No wheezing, rhonchi or rales.  Musculoskeletal:        General: No swelling.     Cervical back: Normal range of motion and neck supple.     Right lower leg: Edema present.     Left lower leg: Edema present.     Comments: Mildly diminished pedal pulses  Lymphadenopathy:     Cervical: No cervical adenopathy.  Skin:    General: Skin is warm and dry.     Findings: No rash.  Neurological:     Mental Status: He is alert.  Psychiatric:        Mood and Affect: Mood normal.        Behavior: Behavior normal.       Lab Results  Component Value Date   CREATININE 1.15 03/19/2022   CREATININE 1.15 03/19/2022    BUN 18 03/19/2022   BUN 18 03/19/2022   NA 141 03/19/2022   NA 141 03/19/2022   K 4.3 03/19/2022   K 4.3 03/19/2022   CL 102 03/19/2022   CL 102 03/19/2022   CO2 32 03/19/2022   CO2 32 03/19/2022     Assessment & Plan:   Problem List Items Addressed This Visit     Essential hypertension - Primary    Chronic, improved but still above goal.  Will start losartan/hctz 100/12.'5mg'$  daily.  RTC 10d lab visit only.  RTC 3 mo f/u visit.       Relevant Medications   losartan-hydrochlorothiazide (HYZAAR) 100-12.5 MG tablet   Other Relevant Orders   Basic metabolic panel   History of pulmonary embolism   History of DVT (deep vein thrombosis)   Relevant Orders   Ambulatory referral to Occupational Therapy   Lymphedema    Chronic. Suspect lymphedema. Reviewed pathophysiology of lymphedema.  Unable to wear compression stockings.  Refer to occupational therapy.       Relevant Orders   Ambulatory referral to Occupational Therapy   Chronic dyspnea    Chronic exertional dyspnea since DVT/PE 2009.  Anticipate this along with obesity contribute. Denies significant OSA symptoms. Not consistent with asthma. Non smoker.  Consider updated EKG, echo, lung imaging.         Meds ordered this encounter  Medications   losartan-hydrochlorothiazide (HYZAAR) 100-12.5 MG tablet    Sig: Take 1 tablet by mouth daily.    Dispense:  90 tablet    Refill:  2    To replace plain losartan   Orders Placed This Encounter  Procedures   Basic metabolic panel    Standing Status:   Future    Standing Expiration Date:   06/27/2023   Ambulatory referral to Occupational Therapy    Referral Priority:   Routine    Referral Type:   Occupational Therapy    Referral Reason:   Specialty Services Required    Requested Specialty:   Occupational Therapy    Number of Visits Requested:   1    Patient instructions: Blood pressure staying mildly elevated but overall improved Start combo BP medicine losartan  hydrochlorothiazide 100/12.'5mg'$  daily - take in the morning.  We will refer you to occupational therapy for evaluation of possible lymphedema. Return in 10 days for lab visit only.  Return in 3 month for follow up visit.  Follow up plan: Return  in about 3 months (around 09/26/2022) for follow up visit.  Ria Bush, MD

## 2022-06-26 NOTE — Assessment & Plan Note (Addendum)
Chronic. Suspect lymphedema. Reviewed pathophysiology of lymphedema.  Unable to wear compression stockings.  Refer to occupational therapy.

## 2022-06-26 NOTE — Assessment & Plan Note (Signed)
Chronic, improved but still above goal.  Will start losartan/hctz 100/12.'5mg'$  daily.  RTC 10d lab visit only.  RTC 3 mo f/u visit.

## 2022-06-26 NOTE — Patient Instructions (Addendum)
Blood pressure staying mildly elevated but overall improved Start combo BP medicine losartan hydrochlorothiazide 100/12.'5mg'$  daily - take in the morning.  We will refer you to occupational therapy for evaluation of possible lymphedema. Return in 10 days for lab visit only.  Return in 3 month for follow up visit.   Lymphedema  Lymphedema is swelling that is caused by the abnormal collection of lymph in the tissues under the skin. Lymph is excess fluid from the tissues in your body that is removed through the lymphatic system. This system is part of your body's defense system (immune system) and includes lymph nodes and lymph vessels. The lymph vessels collect and carry the excess fluid, fats, proteins, and waste from the tissues of the body to the bloodstream. This system also works to clean and remove bacteria and waste products from the body. Lymphedema occurs when the lymphatic system is blocked. When the lymph vessels or lymph nodes are blocked or damaged, lymph does not drain properly. This causes an abnormal buildup of lymph, which leads to swelling in the affected area. This may include the trunk area, or an arm or leg. Lymphedema cannot be cured by medicines, but various methods can be used to help reduce the swelling. What are the causes? The cause of this condition depends on the type of lymphedema that you have. Primary lymphedema is caused by the absence of lymph vessels or having abnormal lymph vessels at birth. Secondary lymphedema occurs when lymph vessels are blocked or damaged. Secondary lymphedema is more common. Common causes of lymph vessel blockage include: Skin infection, such as cellulitis. Infection by parasites (filariasis). Injury. Radiation therapy. Cancer. Formation of scar tissue. Surgery. What are the signs or symptoms? Symptoms of this condition include: Swelling of the arm or leg. A heavy or tight feeling in the arm or leg. Swelling of the feet, toes, or  fingers. Shoes or rings may fit more tightly than before. Redness of the skin over the affected area. Limited movement of the affected limb. Sensitivity to touch or discomfort in the affected limb. How is this diagnosed? This condition may be diagnosed based on: Your symptoms and medical history. A physical exam. Bioimpedance spectroscopy. In this test, painless electrical currents are used to measure fluid levels in your body. Imaging tests, such as: MRI. CT scan. Duplex ultrasound. This test uses sound waves to produce images of the vessels and the blood flow on a screen. Lymphoscintigraphy. In this test, a low dose of a radioactive substance is injected to trace the flow of lymph through your lymph vessels. Lymphangiography. In this test, a contrast dye is injected into the lymph vessel to help show blockages. How is this treated?  If an underlying condition is causing the lymphedema, that condition will be treated. For example, antibiotic medicines may be used to treat an infection. Treatment for this condition will depend on the cause of your lymphedema. Treatment may include: Complete decongestive therapy (CDT). This is done by a certified lymphedema therapist to reduce fluid congestion. This therapy includes: Skin care. Compression wrapping of the affected area. Manual lymph drainage. This is a special massage technique that promotes lymph drainage out of a limb. Specific exercises. Certain exercises can help fluid move out of the affected limb. Compression. Various methods may be used to apply pressure to the affected limb to reduce the swelling. They include: Wearing compression stockings or sleeves on the affected limb. Wrapping the affected limb with special bandages. Surgery. This is usually done for severe  cases only. For example, surgery may be done if you have trouble moving the limb or if the swelling does not get better with other treatments. Follow these instructions at  home: Self-care The affected area is more likely to become injured or infected. Take these steps to help prevent infection: Keep the affected area clean and dry. Use approved creams or lotions to keep the skin moisturized. Protect your skin from cuts: Use gloves while cooking or gardening. Do not walk barefoot. If you shave the affected area, use an Copy. Do not wear tight clothes, shoes, or jewelry. Eat a healthy diet that includes a lot of fruits and vegetables. Activity Do exercises as told by your health care provider. Do not sit with your legs crossed. When possible, keep the affected limb raised (elevated) above the level of your heart. Avoid carrying things with an arm that is affected by lymphedema. General instructions Wear compression stockings or sleeves as told by your health care provider. Note any changes in size of the affected limb. You may be instructed to take regular measurements and keep track of them. Take over-the-counter and prescription medicines only as told by your health care provider. If you were prescribed an antibiotic medicine, take or apply it as told by your health care provider. Do not stop using the antibiotic even if you start to feel better or if your condition improves. Do not use heating pads or ice packs on the affected area. Avoid having blood draws, IV insertions, or blood pressure checks on the affected limb. Keep all follow-up visits. This is important. Contact a health care provider if you: Continue to have swelling in your limb. Have fluid leaking from the skin of your swollen limb. Have a cut that does not heal. Have redness or pain in the affected area. Develop purplish spots, rash, blisters, or sores (lesions) on your affected limb. Get help right away if you: Have new swelling in your limb that starts suddenly. Have shortness of breath or chest pain. Have a fever or chills. These symptoms may represent a serious problem  that is an emergency. Do not wait to see if the symptoms will go away. Get medical help right away. Call your local emergency services (911 in the [place] Do not drive yourself to the hospital. Summary Lymphedema is swelling that is caused by the abnormal collection of lymph in the tissues under the skin. Lymph is fluid from the tissues in your body that is removed through the lymphatic system. This system collects and carries excess fluid, fats, proteins, and wastes from the tissues of the body to the bloodstream. Lymphedema causes swelling, pain, and redness in the affected area. This may include the trunk area, or an arm or leg. Treatment for this condition may depend on the cause of your lymphedema. Treatment may include treating the underlying cause, complete decongestive therapy (CDT), compression methods, or surgery. This information is not intended to replace advice given to you by your health care provider. Make sure you discuss any questions you have with your health care provider. Document Revised: 09/07/2020 Document Reviewed: 09/07/2020 Elsevier Patient Education  [place].

## 2022-06-27 ENCOUNTER — Encounter: Payer: Self-pay | Admitting: Internal Medicine

## 2022-06-27 ENCOUNTER — Ambulatory Visit (AMBULATORY_SURGERY_CENTER): Payer: Medicare Other | Admitting: Internal Medicine

## 2022-06-27 VITALS — BP 150/92 | HR 70 | Temp 97.1°F | Resp 17 | Ht 70.0 in | Wt 336.8 lb

## 2022-06-27 DIAGNOSIS — D128 Benign neoplasm of rectum: Secondary | ICD-10-CM | POA: Diagnosis not present

## 2022-06-27 DIAGNOSIS — K621 Rectal polyp: Secondary | ICD-10-CM

## 2022-06-27 DIAGNOSIS — Z09 Encounter for follow-up examination after completed treatment for conditions other than malignant neoplasm: Secondary | ICD-10-CM

## 2022-06-27 DIAGNOSIS — Z86010 Personal history of colonic polyps: Secondary | ICD-10-CM

## 2022-06-27 MED ORDER — SODIUM CHLORIDE 0.9 % IV SOLN: 500.0000 mL | Freq: Once | INTRAVENOUS | Status: DC

## 2022-06-27 NOTE — Progress Notes (Signed)
[facility] History and Physical   Primary Care Physician:  Ria Bush, MD   Reason for Procedure:   Hx polyps  Plan:    colonoscopy     HPI: Philip Middleton is a 66 y.o. 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[place]) 2009   with pulmonary infarcts s/p 6+ mo coumadin   Hyperlipemia    Hypertension    left thumb ORIF    fracture   Peri-rectal abscess    repair x 4   Personal history of colonic adenomas     Past Surgical History:  Procedure Laterality Date   COLONOSCOPY  10/2013   4 diminutive polyps, rpt 3 yrs [name])   COLONOSCOPY  06/2017   3 TA, rpt 3 yrs Occupational hygienist)   DENTAL RESTORATION/EXTRACTION WITH X-RAY     LE venous U/S nml  03/29/09   L residual thrombus in SFV and poplitael veins   LE venous U/S nml  10/2009   no superficial or deep thrombus   ORIF FINGER / THUMB FRACTURE      Prior to Admission medications   Medication Sig Start Date End Date Taking? Authorizing Provider  aspirin 81 MG EC tablet Take 1 tablet (81 mg total) by mouth daily. Swallow whole. 04/21/21  Yes Ria Bush, MD  atorvastatin (LIPITOR) 40 MG tablet TAKE 1 TABLET BY MOUTH DAILY AT 6 PM. 03/26/22  Yes Ria Bush, MD  Multiple Vitamin (MULTIVITAMIN) tablet Take 1 tablet by mouth daily.   Yes [provider]  naproxen sodium (ALEVE) 220 MG tablet Take 2 tablets (440 mg total) by mouth daily as needed. 09/12/18  Yes Ria Bush, MD  losartan-hydrochlorothiazide [facility]) 100-12.5 MG tablet Take 1 tablet by mouth daily. Patient not taking: Reported on 06/27/2022 06/26/22   Ria Bush, MD  Omega-3 Fatty Acids (FISH OIL) 1000 MG CAPS Take 1 capsule (1,000 mg total) by mouth daily. 04/21/21   Ria Bush, MD  sildenafil  (VIAGRA) 100 MG tablet Take 0.5-1 tablets (50-100 mg total) by mouth daily as needed for erectile dysfunction. 08/20/16   Ria Bush, MD    Current Outpatient Medications  Medication Sig Dispense Refill   aspirin 81 MG EC tablet Take 1 tablet (81 mg total) by mouth daily. Swallow whole.     atorvastatin (LIPITOR) 40 MG tablet TAKE 1 TABLET BY MOUTH DAILY AT 6 PM. 90 tablet 3   Multiple Vitamin (MULTIVITAMIN) tablet Take 1 tablet by mouth daily.     naproxen sodium (ALEVE) 220 MG tablet Take 2 tablets (440 mg total) by mouth daily as needed.     losartan-hydrochlorothiazide (HYZAAR) 100-12.5 MG tablet Take 1 tablet by mouth daily. (Patient not taking: Reported on 06/27/2022) 90 tablet 2   Omega-3 Fatty Acids (FISH OIL) 1000 MG CAPS Take 1 capsule (1,000 mg total) by mouth daily.     sildenafil (VIAGRA) 100 MG tablet Take 0.5-1 tablets (50-100 mg total) by mouth daily as needed for erectile dysfunction. 5 tablet 11   Current Facility-Administered Medications  Medication Dose Route Frequency Provider Last Rate Last Admin   0.9 %  sodium chloride infusion  500 mL Intravenous Once Gatha Mayer, MD        Allergies as of 06/27/2022   (No Known Allergies)  Family History  Problem Relation Age of Onset   Cancer Mother        metastatic cancer, ? ovarian   Diabetes Sister    Sleep apnea Brother    Cancer Maternal Uncle 73       prostate   Colon cancer Neg Hx    Esophageal cancer Neg Hx    Stomach cancer Neg Hx     Social History   Socioeconomic History   Marital status: Single    Spouse name: Not on file   Number of children: Not on file   Years of education: Not on file   Highest education level: Not on file  Occupational History   Not on file  Tobacco Use   Smoking status: Former    Types: Cigars   Smokeless tobacco: Never   Tobacco comments:    smoked 1-2 daily, quit 2005  Vaping Use   Vaping Use: Never used  Substance and Sexual Activity   Alcohol use: Yes     Alcohol/week: 5.0 standard drinks of alcohol    Types: 5 Standard drinks or equivalent per week   Drug use: No   Sexual activity: Never  Other Topics Concern   Not on file  Social History Narrative   Caffeine: 2-3 cups coffee/day   Lives alone, no pets, 1 child   Occupation: Proofreader, warehouse   Activity: stays active at work, no regular exercise   Diet: good water, daily fruits/vegetables    Social Determinants of Health   Financial Resource Strain: Low Risk  (03/23/2022)   Overall Financial Resource Strain (CARDIA)    Difficulty of Paying Living Expenses: Not hard at all  Food Insecurity: No Food Insecurity (03/23/2022)   Hunger Vital Sign    Worried About Running Out of Food in the Last Year: Never true    [place] in the Last Year: Never true  Transportation Needs: No Transportation Needs (03/23/2022)   PRAPARE - Hydrologist (Medical): No    Lack of Transportation (Non-Medical): No  Physical Activity: Inactive (03/23/2022)   Exercise Vital Sign    Days of Exercise per Week: 0 days    Minutes of Exercise per Session: 0 min  Stress: No Stress Concern Present (03/23/2022)   [place]    Feeling of Stress : Not at all  Social Connections: Moderately Integrated (03/23/2022)   Social Connection and Isolation Panel [NHANES]    Frequency of Communication with Friends and Family: More than three times a week    Frequency of Social Gatherings with Friends and Family: More than three times a week    Attends Religious Services: More than 4 times per year    Active Member of [employer] or Organizations: Yes    Attends Music therapist: More than 4 times per year    Marital Status: Divorced  Intimate Partner Violence: Not At Risk (03/23/2022)   Humiliation, Afraid, Rape, and Kick questionnaire    Fear of Current or Ex-Partner: No    Emotionally Abused: No    Physically  Abused: No    Sexually Abused: No    Review of Systems:  All other review of systems negative except as mentioned in the HPI.  Physical Exam: Vital signs BP (!) 178/95   Pulse 66   Temp (!) 97.1 F (36.2 C)   Ht '5\' 10"'$  (1.778 m)   Wt (!) 336 lb 12.8 oz (  152.8 kg)   SpO2 98%   BMI 48.33 kg/m   General:   Alert,  Well-developed, well-nourished, pleasant and cooperative in NAD Lungs:  Clear throughout to auscultation.   Heart:  Regular rate and rhythm; no murmurs, clicks, rubs,  or gallops. Abdomen:  Soft, nontender and nondistended. Normal bowel sounds.   Neuro/Psych:  Alert and cooperative. Normal mood and affect. A and O x 3   '@Cephus Tupy'$  Simonne Maffucci, MD, [facility] [phone] (pager) 06/27/2022 8:34 AM@

## 2022-06-27 NOTE — Op Note (Signed)
[facility] Patient Name: Philip Middleton Procedure Date: 06/27/2022 8:31 AM MRN: [id number] Endoscopist: Gatha Mayer , MD Age: 66 Referring MD:  Date of Birth: 01-Sep-1956 Gender: Male Account #: [id number] Procedure:                Colonoscopy Indications:              Surveillance: Personal history of adenomatous                            polyps on last colonoscopy 5 years ago, Last                            colonoscopy: 2018 Medicines:                Monitored Anesthesia Care Procedure:                Pre-Anesthesia Assessment:                           - Prior to the procedure, a History and Physical                            was performed, and patient medications and                            allergies were reviewed. The patient's tolerance of                            previous anesthesia was also reviewed. The risks                            and benefits of the procedure and the sedation                            options and risks were discussed with the patient.                            All questions were answered, and informed consent                            was obtained. Prior Anticoagulants: The patient has                            taken no previous anticoagulant or antiplatelet                            agents. ASA Grade Assessment: III - A patient with                            severe systemic disease. After reviewing the risks                            and benefits, the patient was deemed in  satisfactory condition to undergo the procedure.                           After obtaining informed consent, the colonoscope                            was passed under direct vision. Throughout the                            procedure, the patient's blood pressure, pulse, and                            oxygen saturations were monitored continuously. The                            Olympus CF-HQ190L (Serial# [id number]) Colonoscope was                             introduced through the anus and advanced to the the                            cecum, identified by appendiceal orifice and                            ileocecal valve. The colonoscopy was performed                            without difficulty. The patient tolerated the                            procedure well. The quality of the bowel                            preparation was good. The ileocecal valve,                            appendiceal orifice, and rectum were photographed.                            The bowel preparation used was Miralax via split                            dose instruction. Scope In: 8:44:42 AM Scope Out: 9:03:40 AM Scope Withdrawal Time: 0 hours 13 minutes 33 seconds  Total Procedure Duration: 0 hours 18 minutes 58 seconds  Findings:                 The perianal and digital rectal examinations were                            normal.                           A diminutive polyp was found in the rectum. The  polyp was flat. The polyp was removed with a cold                            snare. Resection and retrieval were complete.                            Verification of patient identification for the                            specimen was done. Estimated blood loss was minimal.                           Internal hemorrhoids were found.                           The exam was otherwise without abnormality on                            direct and retroflexion views. Complications:            No immediate complications. Estimated Blood Loss:     Estimated blood loss was minimal. Impression:               - One diminutive polyp in the rectum, removed with                            a cold snare. Resected and retrieved.                           - Internal hemorrhoids.                           - The examination was otherwise normal on direct                            and retroflexion views.                            - Personal history of colonic polyps. 11/13/2013 5                            diminutive polyps removed 4 adenomas 1 hyperplastic                           06/2017 - 3 diminutive adenomas + 1 hyperplastic Recommendation:           - Patient has a contact number available for                            emergencies. The signs and symptoms of potential                            delayed complications were discussed with the                            patient. Return  to normal activities tomorrow.                            Written discharge instructions were provided to the                            patient.                           - Resume previous diet.                           - Continue present medications.                           - Repeat colonoscopy is recommended for                            surveillance. The colonoscopy date will be                            determined after pathology results from today's                            exam become available for review. Gatha Mayer, MD 06/27/2022 9:10:53 AM This report has been signed electronically.

## 2022-06-27 NOTE — Progress Notes (Signed)
PT taken to PACU. Monitors in place. VSS. Report given to RN. 

## 2022-06-27 NOTE — Patient Instructions (Addendum)
I found and removed one tiny polyp. Hemorrhoids were swollen - happens from the prep.  I will let you know pathology results and when to have another routine colonoscopy by mail and/or My Chart.  I appreciate the opportunity to care for you. Gatha Mayer, MD, FACG  YOU HAD AN ENDOSCOPIC PROCEDURE TODAY AT [facility]:   Refer to the procedure report that was given to you for any specific questions about what was found during the examination.  If the procedure report does not answer your questions, please call your gastroenterologist to clarify.  If you requested that your care partner not be given the details of your procedure findings, then the procedure report has been included in a sealed envelope for you to review at your convenience later.  YOU SHOULD EXPECT: Some feelings of bloating in the abdomen. Passage of more gas than usual.  Walking can help get rid of the air that was put into your GI tract during the procedure and reduce the bloating. If you had a lower endoscopy (such as a colonoscopy or flexible sigmoidoscopy) you may notice spotting of blood in your stool or on the toilet paper. If you underwent a bowel prep for your procedure, you may not have a normal bowel movement for a few days.  Please Note:  You might notice some irritation and congestion in your nose or some drainage.  This is from the oxygen used during your procedure.  There is no need for concern and it should clear up in a day or so.  SYMPTOMS TO REPORT IMMEDIATELY:  Following lower endoscopy (colonoscopy or flexible sigmoidoscopy):  Excessive amounts of blood in the stool  Significant tenderness or worsening of abdominal pains  Swelling of the abdomen that is new, acute  Fever of 100F or higher  Following upper endoscopy (EGD)  Vomiting of blood or coffee ground material  New chest pain or pain under the shoulder blades  Painful or persistently difficult swallowing  New shortness of  breath  Fever of 100F or higher  Black, tarry-looking stools  For urgent or emergent issues, a gastroenterologist can be reached at any hour by calling [phone]. Do not use MyChart messaging for urgent concerns.    DIET:  We do recommend a small meal at first, but then you may proceed to your regular diet.  Drink plenty of fluids but you should avoid alcoholic beverages for 24 hours.  ACTIVITY:  You should plan to take it easy for the rest of today and you should NOT DRIVE or use heavy machinery until tomorrow (because of the sedation medicines used during the test).    FOLLOW UP: Our staff will call the number listed on your records the next business day following your procedure.  We will call around 7:15- 8:00 am to check on you and address any questions or concerns that you may have regarding the information given to you following your procedure. If we do not reach you, we will leave a message.  If you develop any symptoms (ie: fever, flu-like symptoms, shortness of breath, cough etc.) before then, please call [phone].  If you test positive for Covid 19 in the 2 weeks post procedure, please call and report this information to [place].    If any biopsies were taken you will be contacted by phone or by letter within the next 1-3 weeks.  Please call us at [phone] if you have not heard about the biopsies in 3 weeks.  SIGNATURES/CONFIDENTIALITY: You and/or your care partner have signed paperwork which will be entered into your electronic medical record.  These signatures attest to the fact that that the information above on your After Visit Summary has been reviewed and is understood.  Full responsibility of the confidentiality of this discharge information lies with you and/or your care-partner.

## 2022-06-27 NOTE — Progress Notes (Signed)
Called to room to assist during endoscopic procedure.  Patient ID and intended procedure confirmed with present staff. Received instructions for my participation in the procedure from the performing physician.  

## 2022-06-28 ENCOUNTER — Telehealth: Payer: Self-pay | Admitting: *Deleted

## 2022-06-28 NOTE — Telephone Encounter (Signed)
  Follow up Call-     06/27/2022    8:14 AM  Call back number  Post procedure Call Back phone  # [phone]  Permission to leave phone message Yes     Patient questions:  Message left to call us if necessary.

## 2022-07-03 ENCOUNTER — Encounter: Payer: Self-pay | Admitting: Internal Medicine

## 2022-07-03 DIAGNOSIS — Z86010 Personal history of colonic polyps: Secondary | ICD-10-CM

## 2022-07-06 ENCOUNTER — Other Ambulatory Visit (INDEPENDENT_AMBULATORY_CARE_PROVIDER_SITE_OTHER): Payer: Medicare Other

## 2022-07-06 DIAGNOSIS — I10 Essential (primary) hypertension: Secondary | ICD-10-CM

## 2022-07-06 LAB — BASIC METABOLIC PANEL
BUN: 21 mg/dL (ref 6–23)
CO2: 32 mEq/L (ref 19–32)
Calcium: 9.7 mg/dL (ref 8.4–10.5)
Chloride: 102 mEq/L (ref 96–112)
Creatinine, Ser: 1.03 mg/dL (ref 0.40–1.50)
GFR: 75.14 mL/min (ref >60.00)
Glucose, Bld: 96 mg/dL (ref 70–99)
Potassium: 3.9 mEq/L (ref 3.5–5.1)
Sodium: 139 mEq/L (ref 135–145)

## 2022-07-07 ENCOUNTER — Encounter: Payer: Self-pay | Admitting: Family Medicine

## 2022-07-09 NOTE — Addendum Note (Signed)
Addended by: Ria Bush on: 07/09/2022 07:51 AM   Modules accepted: Orders

## 2022-07-09 NOTE — Addendum Note (Signed)
Addended by: Ria Bush on: 07/09/2022 07:53 AM   Modules accepted: Orders

## 2022-08-29 DIAGNOSIS — M1612 Unilateral primary osteoarthritis, left hip: Secondary | ICD-10-CM | POA: Diagnosis not present

## 2022-09-03 DIAGNOSIS — M1711 Unilateral primary osteoarthritis, right knee: Secondary | ICD-10-CM | POA: Diagnosis not present

## 2022-09-26 ENCOUNTER — Ambulatory Visit (INDEPENDENT_AMBULATORY_CARE_PROVIDER_SITE_OTHER): Payer: Medicare Other | Admitting: Family Medicine

## 2022-09-26 ENCOUNTER — Ambulatory Visit (INDEPENDENT_AMBULATORY_CARE_PROVIDER_SITE_OTHER)
Admission: RE | Admit: 2022-09-26 | Discharge: 2022-09-26 | Disposition: A | Discharge status: Home / Self Care | Payer: Medicare Other | Source: Ambulatory Visit | Attending: Family Medicine | Admitting: Family Medicine

## 2022-09-26 ENCOUNTER — Encounter: Payer: Self-pay | Admitting: Family Medicine

## 2022-09-26 VITALS — BP 144/110 | HR 87 | Temp 97.6°F | Ht 70.0 in | Wt 341.4 lb

## 2022-09-26 DIAGNOSIS — R600 Localized edema: Secondary | ICD-10-CM | POA: Diagnosis not present

## 2022-09-26 DIAGNOSIS — R0609 Other forms of dyspnea: Secondary | ICD-10-CM

## 2022-09-26 DIAGNOSIS — I10 Essential (primary) hypertension: Secondary | ICD-10-CM

## 2022-09-26 DIAGNOSIS — Z23 Encounter for immunization: Secondary | ICD-10-CM | POA: Diagnosis not present

## 2022-09-26 DIAGNOSIS — Z86711 Personal history of pulmonary embolism: Secondary | ICD-10-CM | POA: Diagnosis not present

## 2022-09-26 DIAGNOSIS — R0600 Dyspnea, unspecified: Secondary | ICD-10-CM | POA: Diagnosis not present

## 2022-09-26 MED ORDER — LOSARTAN POTASSIUM-HCTZ 100-25 MG PO TABS: 1.0000 | ORAL_TABLET | Freq: Every day | ORAL | 2 refills | Status: DC

## 2022-09-26 NOTE — Assessment & Plan Note (Signed)
Chronic, still above goal. Increase hyzaar to 100/'25mg'$   RTC 1 wk after increased dose for lab visit  RTC 3 mo HTN f/u visit.

## 2022-09-26 NOTE — Assessment & Plan Note (Signed)
Actually pedal edema is some better since starting hctz 12.'5mg'$  - will increase to '25mg'$  daily and reassess. Consider re-attempt at lymphedema occupational therapy eval in [place] in the future.

## 2022-09-26 NOTE — Patient Instructions (Addendum)
Flu shot today  Bp is staying too high - increase losartan/hydrochlorothiazide to 100/'25mg'$  dose daily in the morning - new dose sent to pharmacy. Schedule lab visit in 1 week to recheck potassium and kidneys. EKG today, chest xray today for shortness of breath.  Return in 3 months for BP f/u visit.

## 2022-09-26 NOTE — Progress Notes (Signed)
Patient ID: Philip Middleton, male    DOB: 10-03-1956, 66 y.o.   MRN: [id number]  This visit was conducted in person.  BP (!) 144/110 (BP Location: Right Arm, Cuff Size: Large)   Pulse 87   Temp 97.6 F (36.4 C) (Temporal)   Ht '5\' 10"'$  (1.778 m)   Wt (!) 341 lb 6 oz (154.8 kg)   SpO2 98%   BMI 48.98 kg/m   BP Readings from Last 3 Encounters:  09/26/22 (!) 144/110  06/27/22 (!) 150/92  06/26/22 (!) 144/90   CC: HTN f/u visit  Subjective:   HPI: Philip Middleton is a 66 y.o. male presenting on 09/26/2022 for Hypertension (Here for 3 mo f/u.)   HTN - Compliant with current antihypertensive regimen of losartah hctz 100/12.'5mg'$  daily. Does not check blood pressures at home.  No low blood pressure readings or symptoms of dizziness/syncope. Denies HA, vision changes, CP/tightness.  Chronic dyspnea and leg swelling.   Last visit referred to lymphedema OT clinic - told no one in [place] would do lymphedema OT.   Ongoing bilateral hip and knee pains - seeing ortho who recommended weight <300 lbs before any surgery.      Relevant past medical, surgical, family and social history reviewed and updated as indicated. Interim medical history since our last visit reviewed. Allergies and medications reviewed and updated. Outpatient Medications Prior to Visit  Medication Sig Dispense Refill   aspirin 81 MG EC tablet Take 1 tablet (81 mg total) by mouth daily. Swallow whole.     atorvastatin (LIPITOR) 40 MG tablet TAKE 1 TABLET BY MOUTH DAILY AT 6 PM. 90 tablet 3   Multiple Vitamin (MULTIVITAMIN) tablet Take 1 tablet by mouth daily.     naproxen sodium (ALEVE) 220 MG tablet Take 2 tablets (440 mg total) by mouth daily as needed.     Omega-3 Fatty Acids (FISH OIL) 1000 MG CAPS Take 1 capsule (1,000 mg total) by mouth daily.     sildenafil (VIAGRA) 100 MG tablet Take 0.5-1 tablets (50-100 mg total) by mouth daily as needed for erectile dysfunction. 5 tablet 11   losartan-hydrochlorothiazide  (HYZAAR) 100-12.5 MG tablet Take 1 tablet by mouth daily. 90 tablet 2   No facility-administered medications prior to visit.     Per HPI unless specifically indicated in ROS section below Review of Systems  Objective:  BP (!) 144/110 (BP Location: Right Arm, Cuff Size: Large)   Pulse 87   Temp 97.6 F (36.4 C) (Temporal)   Ht '5\' 10"'$  (1.778 m)   Wt (!) 341 lb 6 oz (154.8 kg)   SpO2 98%   BMI 48.98 kg/m   Wt Readings from Last 3 Encounters:  09/26/22 (!) 341 lb 6 oz (154.8 kg)  06/27/22 (!) 336 lb 12.8 oz (152.8 kg)  06/26/22 (!) 338 lb 2 oz (153.4 kg)      Physical Exam Vitals and nursing note reviewed.  Constitutional:      Appearance: Normal appearance. He is obese. He is not ill-appearing.  Cardiovascular:     Rate and Rhythm: Normal rate and regular rhythm.     Pulses: Normal pulses.     Heart sounds: Normal heart sounds. No murmur heard. Pulmonary:     Effort: Pulmonary effort is normal. No respiratory distress.     Breath sounds: Normal breath sounds. No wheezing, rhonchi or rales.  Musculoskeletal:        General: Swelling present.     Right lower leg:  Edema (tr) present.     Left lower leg: Edema (tr) present.  Neurological:     Mental Status: He is alert.  Psychiatric:        Mood and Affect: Mood normal.        Behavior: Behavior normal.       Results for orders placed or performed in visit on 46/65/99  Basic metabolic panel  Result Value Ref Range   Sodium 139 135 - 145 mEq/L   Potassium 3.9 3.5 - 5.1 mEq/L   Chloride 102 96 - 112 mEq/L   CO2 32 19 - 32 mEq/L   Glucose, Bld 96 70 - 99 mg/dL   BUN 21 6 - 23 mg/dL   Creatinine, Ser 1.03 0.40 - 1.50 mg/dL   GFR 75.14 >60.00 mL/min   Calcium 9.7 8.4 - 10.5 mg/dL   EKG - NSR rate 75, normal axis, markedly prolonged PR (1st degree AV block), LAA, nonspecific noncontiguous ST changes  Assessment & Plan:   Problem List Items Addressed This Visit     Severe obesity (BMI >= 40) (HCC)   Essential  hypertension - Primary    Chronic, still above goal. Increase hyzaar to 100/'25mg'$   RTC 1 wk after increased dose for lab visit  RTC 3 mo HTN f/u visit.       Relevant Medications   losartan-hydrochlorothiazide (HYZAAR) 100-25 MG tablet   History of pulmonary embolism   Relevant Orders   DG Chest 2 View   Pedal edema    Actually pedal edema is some better since starting hctz 12.'5mg'$  - will increase to '25mg'$  daily and reassess. Consider re-attempt at lymphedema occupational therapy eval in [place] in the future.       Chronic dyspnea    Endorses chronic exertional dyspnea since DVT/PE 2009 s/p 6 months of coumadin therapy. Anticipate obesity contributes. Prior no OSA symptoms. Non smoker. Good O2 sats.  Will update EKG, CXR. Consider echocardiogram pending results.       Relevant Orders   DG Chest 2 View   Other Visit Diagnoses     Need for influenza vaccination       Relevant Orders   Flu Vaccine QUAD High Dose(Fluad) (Completed)        Meds ordered this encounter  Medications   losartan-hydrochlorothiazide (HYZAAR) 100-25 MG tablet    Sig: Take 1 tablet by mouth daily.    Dispense:  90 tablet    Refill:  2    Note new dose   Orders Placed This Encounter  Procedures   DG Chest 2 View    Standing Status:   Future    Number of Occurrences:   1    Standing Expiration Date:   09/27/2023    Order Specific Question:   Reason for Exam (SYMPTOM  OR DIAGNOSIS REQUIRED)    Answer:   chronic dyspnea    Order Specific Question:   Preferred imaging location?    Answer:   [facility]   Flu Vaccine QUAD High Dose(Fluad)     Patient Instructions  Flu shot today  Bp is staying too high - increase losartan/hydrochlorothiazide to 100/'25mg'$  dose daily in the morning - new dose sent to pharmacy. Schedule lab visit in 1 week to recheck potassium and kidneys. EKG today, chest xray today for shortness of breath.  Return in 3 months for BP f/u visit.  Follow up plan: Return in  about 3 months (around 12/27/2022) for follow up visit.  Ria Bush, MD

## 2022-09-26 NOTE — Assessment & Plan Note (Signed)
Endorses chronic exertional dyspnea since DVT/PE 2009 s/p 6 months of coumadin therapy. Anticipate obesity contributes. Prior no OSA symptoms. Non smoker. Good O2 sats.  Will update EKG, CXR. Consider echocardiogram pending results.

## 2022-09-29 ENCOUNTER — Other Ambulatory Visit: Payer: Self-pay | Admitting: Family Medicine

## 2022-09-29 ENCOUNTER — Encounter: Payer: Self-pay | Admitting: Family Medicine

## 2022-09-29 DIAGNOSIS — R0609 Other forms of dyspnea: Secondary | ICD-10-CM

## 2022-09-29 DIAGNOSIS — I440 Atrioventricular block, first degree: Secondary | ICD-10-CM | POA: Insufficient documentation

## 2022-09-29 DIAGNOSIS — R600 Localized edema: Secondary | ICD-10-CM

## 2022-09-29 DIAGNOSIS — R9431 Abnormal electrocardiogram [ECG] [EKG]: Secondary | ICD-10-CM

## 2022-09-30 ENCOUNTER — Other Ambulatory Visit: Payer: Self-pay | Admitting: Family Medicine

## 2022-09-30 DIAGNOSIS — I10 Essential (primary) hypertension: Secondary | ICD-10-CM

## 2022-10-01 ENCOUNTER — Telehealth: Payer: Self-pay

## 2022-10-01 NOTE — Telephone Encounter (Signed)
Spoke with pt relaying results and Dr. G's message.  Pt verbalizes understanding.  

## 2022-10-01 NOTE — Telephone Encounter (Addendum)
Lvm asking pt to call back.  Need to relay EKG and chest x-ray results and Dr. Synthia Innocent message.  (See ECG, Result Notes- 09/26/22; Imaging, Result Notes- 09/26/22.)  EKG/CXR/Dr. Synthia Innocent msg: Your EKG returned showing possible enlarged left upper chamber of heart with electrical conduction block in that area.  I recommend a heart ultrasound for further evaluation. You will get a call to get this scheduled.   Your chest xray returned reassuringly normal.

## 2022-10-03 ENCOUNTER — Other Ambulatory Visit (INDEPENDENT_AMBULATORY_CARE_PROVIDER_SITE_OTHER): Payer: Medicare Other

## 2022-10-03 DIAGNOSIS — I10 Essential (primary) hypertension: Secondary | ICD-10-CM

## 2022-10-03 LAB — BASIC METABOLIC PANEL
BUN: 17 mg/dL (ref 6–23)
CO2: 32 mEq/L (ref 19–32)
Calcium: 9.7 mg/dL (ref 8.4–10.5)
Chloride: 99 mEq/L (ref 96–112)
Creatinine, Ser: 1.15 mg/dL (ref 0.40–1.50)
GFR: 65.72 mL/min (ref >60.00)
Glucose, Bld: 95 mg/dL (ref 70–99)
Potassium: 3.9 mEq/L (ref 3.5–5.1)
Sodium: 138 mEq/L (ref 135–145)

## 2022-10-10 ENCOUNTER — Ambulatory Visit (HOSPITAL_COMMUNITY): Payer: Medicare Other | Attending: Family Medicine

## 2022-11-02 ENCOUNTER — Ambulatory Visit (HOSPITAL_COMMUNITY): Payer: Medicare Other | Attending: Cardiovascular Disease

## 2022-11-02 DIAGNOSIS — R9431 Abnormal electrocardiogram [ECG] [EKG]: Secondary | ICD-10-CM | POA: Diagnosis not present

## 2022-11-02 DIAGNOSIS — R0609 Other forms of dyspnea: Secondary | ICD-10-CM | POA: Insufficient documentation

## 2022-11-02 DIAGNOSIS — R600 Localized edema: Secondary | ICD-10-CM | POA: Insufficient documentation

## 2022-11-02 LAB — ECHOCARDIOGRAM COMPLETE
Area-P 1/2: 4.36 cm2
S' Lateral: 3.2 cm

## 2022-11-02 MED ORDER — PERFLUTREN LIPID MICROSPHERE
3.0000 mL | INTRAVENOUS | Status: AC | PRN
  Administered 2022-11-02: 3 mL via INTRAVENOUS

## 2022-12-28 ENCOUNTER — Ambulatory Visit: Payer: Medicare Other | Admitting: Family Medicine

## 2023-02-01 DIAGNOSIS — M1711 Unilateral primary osteoarthritis, right knee: Secondary | ICD-10-CM | POA: Diagnosis not present

## 2023-02-13 DIAGNOSIS — M1612 Unilateral primary osteoarthritis, left hip: Secondary | ICD-10-CM | POA: Diagnosis not present

## 2023-03-26 ENCOUNTER — Ambulatory Visit (INDEPENDENT_AMBULATORY_CARE_PROVIDER_SITE_OTHER): Payer: Medicare Other

## 2023-03-26 VITALS — BP 158/62 | HR 71 | Ht 71.0 in | Wt 340.4 lb

## 2023-03-26 DIAGNOSIS — Z0000 Encounter for general adult medical examination without abnormal findings: Secondary | ICD-10-CM

## 2023-03-26 NOTE — Patient Instructions (Signed)
Philip Middleton , Thank you for taking time to come for your Medicare Wellness Visit. I appreciate your ongoing commitment to your health goals. Please review the following plan we discussed and let me know if I can assist you in the future.   These are the goals we discussed:  Goals       Patient Stated      Go fishing, get hip and knee fixed      Weight (lb) < 200 lb (90.7 kg) (pt-stated)      I would like to lose weight to have hip surgery.        This is a list of the screening recommended for you and due dates:  Health Maintenance  Topic Date Due   Zoster (Shingles) Vaccine (1 of 2) Never done   DTaP/Tdap/Td vaccine (3 - Tdap) 01/03/2021   COVID-19 Vaccine (3 - 2023-24 season) 07/27/2022   Flu Shot  06/27/2023   Medicare Annual Wellness Visit  03/25/2024   Colon Cancer Screening  06/28/2027   Pneumonia Vaccine  Completed   Hepatitis C Screening: USPSTF Recommendation to screen - Ages 18-79 yo.  Completed   HPV Vaccine  Aged Out    Advanced directives: No  Conditions/risks identified:Aim for 30 minutes of exercise or brisk walking, 6-8 glasses of water, and 5 servings of fruits and vegetables each day.   Next appointment: Follow up in one year for your annual wellness visit. 03/26/24 @ 8:30 am televist  Preventive Care 68 Years and Older, Male  Preventive care refers to lifestyle choices and visits with your health care provider that can promote health and wellness. What does preventive care include? A yearly physical exam. This is also called an annual well check. Dental exams once or twice a year. Routine eye exams. Ask your health care provider how often you should have your eyes checked. Personal lifestyle choices, including: Daily care of your teeth and gums. Regular physical activity. Eating a healthy diet. Avoiding tobacco and drug use. Limiting alcohol use. Practicing safe sex. Taking low doses of aspirin every day. Taking vitamin and mineral supplements as  recommended by your health care provider. What happens during an annual well check? The services and screenings done by your health care provider during your annual well check will depend on your age, overall health, lifestyle risk factors, and family history of disease. Counseling  Your health care provider may ask you questions about your: Alcohol use. Tobacco use. Drug use. Emotional well-being. Home and relationship well-being. Sexual activity. Eating habits. History of falls. Memory and ability to understand (cognition). Work and work Astronomer. Screening  You may have the following tests or measurements: Height, weight, and BMI. Blood pressure. Lipid and cholesterol levels. These may be checked every 5 years, or more frequently if you are over 2 years old. Skin check. Lung cancer screening. You may have this screening every year starting at age 68 if you have a 30-pack-year history of smoking and currently smoke or have quit within the past 15 years. Fecal occult blood test (FOBT) of the stool. You may have this test every year starting at age 68. Flexible sigmoidoscopy or colonoscopy. You may have a sigmoidoscopy every 5 years or a colonoscopy every 10 years starting at age 59. Prostate cancer screening. Recommendations will vary depending on your family history and other risks. Hepatitis C blood test. Hepatitis B blood test. Sexually transmitted disease (STD) testing. Diabetes screening. This is done by checking your blood sugar (glucose) after  you have not eaten for a while (fasting). You may have this done every 1-3 years. Abdominal aortic aneurysm (AAA) screening. You may need this if you are a current or former smoker. Osteoporosis. You may be screened starting at age 68 if you are at high risk. Talk with your health care provider about your test results, treatment options, and if necessary, the need for more tests. Vaccines  Your health care provider may recommend  certain vaccines, such as: Influenza vaccine. This is recommended every year. Tetanus, diphtheria, and acellular pertussis (Tdap, Td) vaccine. You may need a Td booster every 10 years. Zoster vaccine. You may need this after age 67. Pneumococcal 13-valent conjugate (PCV13) vaccine. One dose is recommended after age 55. Pneumococcal polysaccharide (PPSV23) vaccine. One dose is recommended after age 67. Talk to your health care provider about which screenings and vaccines you need and how often you need them. This information is not intended to replace advice given to you by your health care provider. Make sure you discuss any questions you have with your health care provider. Document Released: 12/09/2015 Document Revised: 08/01/2016 Document Reviewed: 09/13/2015 Elsevier Interactive Patient Education  2017 [employer].  Fall Prevention in the Home Falls can cause injuries. They can happen to people of all ages. There are many things you can do to make your home safe and to help prevent falls. What can I do on the outside of my home? Regularly fix the edges of walkways and driveways and fix any cracks. Remove anything that might make you trip as you walk through a door, such as a raised step or threshold. Trim any bushes or trees on the path to your home. Use bright outdoor lighting. Clear any walking paths of anything that might make someone trip, such as rocks or tools. Regularly check to see if handrails are loose or broken. Make sure that both sides of any steps have handrails. Any raised decks and porches should have guardrails on the edges. Have any leaves, snow, or ice cleared regularly. Use sand or salt on walking paths during winter. Clean up any spills in your garage right away. This includes oil or grease spills. What can I do in the bathroom? Use night lights. Install grab bars by the toilet and in the tub and shower. Do not use towel bars as grab bars. Use non-skid mats or  decals in the tub or shower. If you need to sit down in the shower, use a plastic, non-slip stool. Keep the floor dry. Clean up any water that spills on the floor as soon as it happens. Remove soap buildup in the tub or shower regularly. Attach bath mats securely with double-sided non-slip rug tape. Do not have throw rugs and other things on the floor that can make you trip. What can I do in the bedroom? Use night lights. Make sure that you have a light by your bed that is easy to reach. Do not use any sheets or blankets that are too big for your bed. They should not hang down onto the floor. Have a firm chair that has side arms. You can use this for support while you get dressed. Do not have throw rugs and other things on the floor that can make you trip. What can I do in the kitchen? Clean up any spills right away. Avoid walking on wet floors. Keep items that you use a lot in easy-to-reach places. If you need to reach something above you, use a strong  step stool that has a grab bar. Keep electrical cords out of the way. Do not use floor polish or wax that makes floors slippery. If you must use wax, use non-skid floor wax. Do not have throw rugs and other things on the floor that can make you trip. What can I do with my stairs? Do not leave any items on the stairs. Make sure that there are handrails on both sides of the stairs and use them. Fix handrails that are broken or loose. Make sure that handrails are as long as the stairways. Check any carpeting to make sure that it is firmly attached to the stairs. Fix any carpet that is loose or worn. Avoid having throw rugs at the top or bottom of the stairs. If you do have throw rugs, attach them to the floor with carpet tape. Make sure that you have a light switch at the top of the stairs and the bottom of the stairs. If you do not have them, ask someone to add them for you. What else can I do to help prevent falls? Wear shoes that: Do not  have high heels. Have rubber bottoms. Are comfortable and fit you well. Are closed at the toe. Do not wear sandals. If you use a stepladder: Make sure that it is fully opened. Do not climb a closed stepladder. Make sure that both sides of the stepladder are locked into place. Ask someone to hold it for you, if possible. Clearly mark and make sure that you can see: Any grab bars or handrails. First and last steps. Where the edge of each step is. Use tools that help you move around (mobility aids) if they are needed. These include: Canes. Walkers. Scooters. Crutches. Turn on the lights when you go into a dark area. Replace any light bulbs as soon as they burn out. Set up your furniture so you have a clear path. Avoid moving your furniture around. If any of your floors are uneven, fix them. If there are any pets around you, be aware of where they are. Review your medicines with your doctor. Some medicines can make you feel dizzy. This can increase your chance of falling. Ask your doctor what other things that you can do to help prevent falls. This information is not intended to replace advice given to you by your health care provider. Make sure you discuss any questions you have with your health care provider. Document Released: 09/08/2009 Document Revised: 04/19/2016 Document Reviewed: 12/17/2014 Elsevier Interactive Patient Education  2017 [employer].

## 2023-03-26 NOTE — Progress Notes (Signed)
Subjective:   Philip Middleton is a 67 y.o. male who presents for Medicare Annual/Subsequent preventive examination.  Review of Systems     Cardiac Risk Factors include: hypertension;dyslipidemia;male gender;obesity (BMI >30kg/m2);sedentary lifestyle     Objective:    Today's Vitals   03/26/23 0909 03/26/23 0913  BP:  (!) 158/62  Pulse:  71  SpO2:  96%  Weight: (!) 340 lb (154.2 kg) (!) 340 lb 6.4 oz (154.4 kg)  Height:  5\' 11"  (1.803 m)  PainSc:  5    Body mass index is 47.48 kg/m.     03/26/2023    9:36 AM 03/26/2023    9:28 AM 03/23/2022   10:50 AM 06/28/2017    2:04 PM  Advanced Directives  Does Patient Have a Medical Advance Directive? No No No No  Would patient like information on creating a medical advance directive? No - Patient declined No - Patient declined No - Patient declined     Current Medications (verified) Outpatient Encounter Medications as of 03/26/2023  Medication Sig   aspirin 81 MG EC tablet Take 1 tablet (81 mg total) by mouth daily. Swallow whole.   atorvastatin (LIPITOR) 40 MG tablet TAKE 1 TABLET BY MOUTH DAILY AT 6 PM.   losartan-hydrochlorothiazide (HYZAAR) 100-25 MG tablet Take 1 tablet by mouth daily.   Multiple Vitamin (MULTIVITAMIN) tablet Take 1 tablet by mouth daily.   naproxen sodium (ALEVE) 220 MG tablet Take 2 tablets (440 mg total) by mouth daily as needed.   Omega-3 Fatty Acids (FISH OIL) 1000 MG CAPS Take 1 capsule (1,000 mg total) by mouth daily.   sildenafil (VIAGRA) 100 MG tablet Take 0.5-1 tablets (50-100 mg total) by mouth daily as needed for erectile dysfunction.   No facility-administered encounter medications on file as of 03/26/2023.    Allergies (verified) Patient has no known allergies.   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/p 6+ mo coumadin   Hyperlipemia    Hypertension    left thumb ORIF    fracture   Peri-rectal abscess     repair x 4   Personal history of colonic adenomas    Past Surgical History:  Procedure Laterality Date   COLONOSCOPY  10/2013   4 diminutive polyps, rpt 3 yrs Leone Payor)   COLONOSCOPY  06/2017   3 TA, rpt 3 yrs Leone Payor)   COLONOSCOPY  06/2022   HP, int hem, rpt 5 yrs Teaching laboratory technician)   DENTAL RESTORATION/EXTRACTION WITH X-RAY     LE venous U/S nml  03/29/2009   L residual thrombus in SFV and poplitael veins   LE venous U/S nml  10/2009   no superficial or deep thrombus   ORIF FINGER / THUMB FRACTURE     Family History  Problem Relation Age of Onset   Cancer Mother        metastatic cancer, ? ovarian   Diabetes Sister    Sleep apnea Brother    Cancer Maternal Uncle 25       prostate   Colon cancer Neg Hx    Esophageal cancer Neg Hx    Stomach cancer Neg Hx    Social History   Socioeconomic History   Marital status: Single    Spouse name: Not on file   Number of children: Not on file   Years of education: Not on file   Highest education level: Not on file  Occupational History  Not on file  Tobacco Use   Smoking status: Former    Types: Cigars   Smokeless tobacco: Never   Tobacco comments:    smoked 1-2 daily, quit 2005  Vaping Use   Vaping Use: Never used  Substance and Sexual Activity   Alcohol use: Yes    Alcohol/week: 5.0 standard drinks of alcohol    Types: 5 Standard drinks or equivalent per week   Drug use: No   Sexual activity: Never  Other Topics Concern   Not on file  Social History Narrative   Caffeine: 2-3 cups coffee/day   Lives alone, no pets, 1 child   Occupation: Press photographer, warehouse   Activity: stays active at work, no regular exercise   Diet: good water, daily fruits/vegetables    Social Determinants of Health   Financial Resource Strain: Low Risk  (03/26/2023)   Overall Financial Resource Strain (CARDIA)    Difficulty of Paying Living Expenses: Not hard at all  Food Insecurity: No Food Insecurity (03/26/2023)   Hunger Vital Sign     Worried About Running Out of Food in the Last Year: Never true    Ran Out of Food in the Last Year: Never true  Transportation Needs: No Transportation Needs (03/26/2023)   PRAPARE - Administrator, Civil Service (Medical): No    Lack of Transportation (Non-Medical): No  Physical Activity: Inactive (03/26/2023)   Exercise Vital Sign    Days of Exercise per Week: 0 days    Minutes of Exercise per Session: 0 min  Stress: No Stress Concern Present (03/26/2023)   [employer] of Occupational Health - Occupational Stress Questionnaire    Feeling of Stress : Not at all  Social Connections: Socially Isolated (03/26/2023)   Social Connection and Isolation Panel [NHANES]    Frequency of Communication with Friends and Family: Three times a week    Frequency of Social Gatherings with Friends and Family: Three times a week    Attends Religious Services: Never    Active Member of Clubs or Organizations: No    Attends Banker Meetings: Never    Marital Status: Widowed    Tobacco Counseling Counseling given: No Tobacco comments: smoked 1-2 daily, quit 2005   Clinical Intake:  Pre-visit preparation completed: Yes  Pain : 0-10 Pain Score: 5  Pain Type: Chronic pain Pain Location: Generalized (knee (right)  and hip pain ( left)) Pain Descriptors / Indicators: Pressure Pain Onset: More than a month ago Pain Frequency: Intermittent     Nutritional Risks: None Diabetes: No  How often do you need to have someone help you when you read instructions, pamphlets, or other written materials from your doctor or pharmacy?: 1 - Never  Diabetic?no  Interpreter Needed?: No  Comments: lives alone Information entered by :: Dorene Grebe CMA   Activities of Daily Living    03/26/2023    9:29 AM  In your present state of health, do you have any difficulty performing the following activities:  Hearing? 0  Vision? 0  Difficulty concentrating or making decisions? 0  Walking  or climbing stairs? 0  Comment due to hip and knee pain  Dressing or bathing? 0  Doing errands, shopping? 0  Preparing Food and eating ? N  Using the Toilet? N  In the past six months, have you accidently leaked urine? Y  Comment occassional  Do you have problems with loss of bowel control? N  Managing your Medications? N  Managing your  Finances? N  Housekeeping or managing your Housekeeping? N    Patient Care Team: Eustaquio Boyden, MD as PCP - General (Family Medicine)  Indicate any recent Medical Services you may have received from other than [facility] providers in the past year (date may be approximate).     Assessment:   This is a routine wellness examination for Philip Middleton.  Hearing/Vision screen Hearing Screening - Comments:: Adequate hearing Vision Screening - Comments:: Wears glasss- [facility]  Dietary issues and exercise activities discussed: Current Exercise Habits: The patient does not participate in regular exercise at present, Exercise limited by: orthopedic condition(s) (knee and hip pain)   Goals Addressed             This Visit's Progress    Patient Stated       Go fishing, get hip and knee fixed       Depression Screen    03/26/2023    9:26 AM 03/23/2022   10:45 AM 03/21/2021    2:30 PM 09/12/2018    2:01 PM 08/27/2017    8:29 AM  PHQ 2/9 Scores  PHQ - 2 Score 0 0 0 0 1    Fall Risk    03/26/2023    9:19 AM 03/23/2022   10:48 AM 03/21/2021    2:30 PM  Fall Risk   Falls in the past year? 0 0 0  Number falls in past yr: 0 0   Injury with Fall? 0 0   Risk for fall due to : No Fall Risks No Fall Risks   Follow up Falls prevention discussed;Education provided      FALL RISK PREVENTION PERTAINING TO THE HOME:  Any stairs in or around the home? No  If so, are there any without handrails? No  Home free of loose throw rugs in walkways, pet beds, electrical cords, etc? Yes  Adequate lighting in your home to reduce risk of falls? Yes   ASSISTIVE  DEVICES UTILIZED TO PREVENT FALLS:  Life alert? No  Use of a cane, walker or w/c? Yes  Grab bars in the bathroom? No  Shower chair or bench in shower? Yes  Elevated toilet seat or a handicapped toilet? Yes   TIMED UP AND GO:  Was the test performed? Yes .  Length of time to ambulate 10 feet: 2 sec.   Gait slow and steady with assistive device  Cognitive Function:        03/26/2023    9:34 AM 03/23/2022   10:50 AM  6CIT Screen  What Year? 0 points 0 points  What month? 0 points 0 points  What time? 0 points 0 points  Count back from 20 0 points 0 points  Months in reverse 0 points 0 points  Repeat phrase 0 points 0 points  Total Score 0 points 0 points    Immunizations Immunization History  Administered Date(s) Administered   Fluad Quad(high Dose 65+) 09/26/2022   Influenza, High Dose Seasonal PF 10/05/2021   Influenza-Unspecified 08/26/2014, 09/09/2017   PFIZER(Purple Top)SARS-COV-2 Vaccination 04/26/2020, 05/20/2020   PNEUMOCOCCAL CONJUGATE-20 03/21/2021   Td 12/21/1997, 01/03/2011    TDAP status: Due, Education has been provided regarding the importance of this vaccine. Advised may receive this vaccine at local pharmacy or Health Dept. Aware to provide a copy of the vaccination record if obtained from local pharmacy or Health Dept. Verbalized acceptance and understanding.  Flu Vaccine status: Up to date  Pneumococcal vaccine status: Up to date  Covid-19 vaccine status: Completed vaccines  Qualifies for Shingles Vaccine? No   Zostavax completed No   Shingrix Completed?: No.    Education has been provided regarding the importance of this vaccine. Patient has been advised to call insurance company to determine out of pocket expense if they have not yet received this vaccine. Advised may also receive vaccine at local pharmacy or Health Dept. Verbalized acceptance and understanding.  Screening Tests Health Maintenance  Topic Date Due   Zoster Vaccines- Shingrix (1  of 2) Never done   DTaP/Tdap/Td (3 - Tdap) 01/03/2021   COVID-19 Vaccine (3 - 2023-24 season) 07/27/2022   INFLUENZA VACCINE  06/27/2023   Medicare Annual Wellness (AWV)  03/25/2024   COLONOSCOPY (Pts 45-81yrs Insurance coverage will need to be confirmed)  06/28/2027   Pneumonia Vaccine 21+ Years old  Completed   Hepatitis C Screening  Completed   HPV VACCINES  Aged Out    Health Maintenance  Health Maintenance Due  Topic Date Due   Zoster Vaccines- Shingrix (1 of 2) Never done   DTaP/Tdap/Td (3 - Tdap) 01/03/2021   COVID-19 Vaccine (3 - 2023-24 season) 07/27/2022    Colorectal cancer screening: Type of screening: Colonoscopy. Completed yes. Repeat every 5 years  Lung Cancer Screening: (Low Dose CT Chest recommended if Age 82-80 years, 30 pack-year currently smoking OR have quit w/in 15years.) does not qualify.   Lung Cancer Screening Referral:   Additional Screening:  Hepatitis C Screening: does not qualify; Completed   Vision Screening: Recommended annual ophthalmology exams for early detection of glaucoma and other disorders of the eye. Is the patient up to date with their annual eye exam?  Yes  Who is the provider or what is the name of the office in which the patient attends annual eye exams? [facility] If pt is not established with a provider, would they like to be referred to a provider to establish care? No .   Dental Screening: Recommended annual dental exams for proper oral hygiene  Community Resource Referral / Chronic Care Management: CRR required this visit?  No   CCM required this visit?  No      Plan:     I have personally reviewed and noted the following in the patient's chart:   Medical and social history Use of alcohol, tobacco or illicit drugs  Current medications and supplements including opioid prescriptions. Patient is not currently taking opioid prescriptions. Functional ability and status Nutritional status Physical activity Advanced  directives List of other physicians Hospitalizations, surgeries, and ER visits in previous 12 months Vitals Screenings to include cognitive, depression, and falls Referrals and appointments  In addition, I have reviewed and discussed with patient certain preventive protocols, quality metrics, and best practice recommendations. A written personalized care plan for preventive services as well as general preventive health recommendations were provided to patient.     Maryan Puls, LPN   1/61/0960   Nurse Notes: None Vaccinations: declines all Tdap vaccine: recommend every 10 years Shingles vaccine: recommend Shingrix which is 2 doses 2-6 months apart and over 90% effective     Covid-19: recommend 2 doses one month apart with a booster 6 months later

## 2023-05-25 ENCOUNTER — Other Ambulatory Visit: Payer: Self-pay | Admitting: Family Medicine

## 2023-05-25 DIAGNOSIS — Z86711 Personal history of pulmonary embolism: Secondary | ICD-10-CM

## 2023-05-25 DIAGNOSIS — N401 Enlarged prostate with lower urinary tract symptoms: Secondary | ICD-10-CM

## 2023-05-25 DIAGNOSIS — E785 Hyperlipidemia, unspecified: Secondary | ICD-10-CM

## 2023-05-25 DIAGNOSIS — R7303 Prediabetes: Secondary | ICD-10-CM

## 2023-05-25 DIAGNOSIS — M109 Gout, unspecified: Secondary | ICD-10-CM

## 2023-05-28 ENCOUNTER — Other Ambulatory Visit (INDEPENDENT_AMBULATORY_CARE_PROVIDER_SITE_OTHER): Payer: Medicare Other

## 2023-05-28 DIAGNOSIS — R7303 Prediabetes: Secondary | ICD-10-CM

## 2023-05-28 DIAGNOSIS — N401 Enlarged prostate with lower urinary tract symptoms: Secondary | ICD-10-CM | POA: Diagnosis not present

## 2023-05-28 DIAGNOSIS — E785 Hyperlipidemia, unspecified: Secondary | ICD-10-CM

## 2023-05-28 DIAGNOSIS — R351 Nocturia: Secondary | ICD-10-CM

## 2023-05-28 DIAGNOSIS — M109 Gout, unspecified: Secondary | ICD-10-CM

## 2023-05-28 LAB — CBC WITH DIFFERENTIAL/PLATELET
Basophils Absolute: 0.1 10*3/uL (ref 0.0–0.1)
Basophils Relative: 0.8 % (ref 0.0–3.0)
Eosinophils Absolute: 0.1 10*3/uL (ref 0.0–0.7)
Eosinophils Relative: 1.1 % (ref 0.0–5.0)
HCT: 41.4 % (ref 39.0–52.0)
Hemoglobin: 13.6 g/dL (ref 13.0–17.0)
Lymphocytes Relative: 31.3 % (ref 12.0–46.0)
Lymphs Abs: 2.6 10*3/uL (ref 0.7–4.0)
MCHC: 32.8 g/dL (ref 30.0–36.0)
MCV: 101.5 fl — ABNORMAL HIGH (ref 78.0–100.0)
Monocytes Absolute: 0.7 10*3/uL (ref 0.1–1.0)
Monocytes Relative: 8.2 % (ref 3.0–12.0)
Neutro Abs: 5 10*3/uL (ref 1.4–7.7)
Neutrophils Relative %: 58.6 % (ref 43.0–77.0)
Platelets: 210 10*3/uL (ref 150.0–400.0)
RBC: 4.08 Mil/uL — ABNORMAL LOW (ref 4.22–5.81)
RDW: 15 % (ref 11.5–15.5)
WBC: 8.5 10*3/uL (ref 4.0–10.5)

## 2023-05-28 LAB — COMPREHENSIVE METABOLIC PANEL
ALT: 15 U/L (ref 0–53)
AST: 15 U/L (ref 0–37)
Albumin: 4 g/dL (ref 3.5–5.2)
Alkaline Phosphatase: 60 U/L (ref 39–117)
BUN: 17 mg/dL (ref 6–23)
CO2: 30 mEq/L (ref 19–32)
Calcium: 9.6 mg/dL (ref 8.4–10.5)
Chloride: 100 mEq/L (ref 96–112)
Creatinine, Ser: 1.13 mg/dL (ref 0.40–1.50)
GFR: 66.82 mL/min (ref >60.00)
Glucose, Bld: 96 mg/dL (ref 70–99)
Potassium: 4.2 mEq/L (ref 3.5–5.1)
Sodium: 140 mEq/L (ref 135–145)
Total Bilirubin: 0.6 mg/dL (ref 0.2–1.2)
Total Protein: 6.9 g/dL (ref 6.0–8.3)

## 2023-05-28 LAB — LIPID PANEL
Cholesterol: 181 mg/dL (ref 0–200)
HDL: 75.4 mg/dL (ref >39.00)
LDL Cholesterol: 86 mg/dL (ref 0–99)
NonHDL: 105.37
Total CHOL/HDL Ratio: 2
Triglycerides: 97 mg/dL (ref 0.0–149.0)
VLDL: 19.4 mg/dL (ref 0.0–40.0)

## 2023-05-28 LAB — HEMOGLOBIN A1C: Hgb A1c MFr Bld: 5.8 % (ref 4.6–6.5)

## 2023-05-28 LAB — TSH: TSH: 1.27 u[IU]/mL (ref 0.35–5.50)

## 2023-05-28 LAB — PSA: PSA: 1.09 ng/mL (ref 0.10–4.00)

## 2023-05-28 LAB — URIC ACID: Uric Acid, Serum: 7.7 mg/dL (ref 4.0–7.8)

## 2023-06-02 ENCOUNTER — Other Ambulatory Visit: Payer: Self-pay | Admitting: Family Medicine

## 2023-06-03 NOTE — Telephone Encounter (Signed)
Pt has OV with Dr. Reece Agar tomorrow.

## 2023-06-04 ENCOUNTER — Encounter: Payer: Self-pay | Admitting: Family Medicine

## 2023-06-04 ENCOUNTER — Ambulatory Visit (INDEPENDENT_AMBULATORY_CARE_PROVIDER_SITE_OTHER): Payer: Medicare Other | Admitting: Family Medicine

## 2023-06-04 VITALS — BP 162/84 | HR 87 | Temp 97.5°F | Ht 69.5 in | Wt 336.2 lb

## 2023-06-04 DIAGNOSIS — N401 Enlarged prostate with lower urinary tract symptoms: Secondary | ICD-10-CM

## 2023-06-04 DIAGNOSIS — I890 Lymphedema, not elsewhere classified: Secondary | ICD-10-CM | POA: Diagnosis not present

## 2023-06-04 DIAGNOSIS — M1612 Unilateral primary osteoarthritis, left hip: Secondary | ICD-10-CM

## 2023-06-04 DIAGNOSIS — R7303 Prediabetes: Secondary | ICD-10-CM

## 2023-06-04 DIAGNOSIS — M109 Gout, unspecified: Secondary | ICD-10-CM

## 2023-06-04 DIAGNOSIS — Z86711 Personal history of pulmonary embolism: Secondary | ICD-10-CM | POA: Diagnosis not present

## 2023-06-04 DIAGNOSIS — E785 Hyperlipidemia, unspecified: Secondary | ICD-10-CM | POA: Diagnosis not present

## 2023-06-04 DIAGNOSIS — M1711 Unilateral primary osteoarthritis, right knee: Secondary | ICD-10-CM

## 2023-06-04 DIAGNOSIS — I10 Essential (primary) hypertension: Secondary | ICD-10-CM | POA: Diagnosis not present

## 2023-06-04 DIAGNOSIS — D7589 Other specified diseases of blood and blood-forming organs: Secondary | ICD-10-CM

## 2023-06-04 DIAGNOSIS — Z0001 Encounter for general adult medical examination with abnormal findings: Secondary | ICD-10-CM

## 2023-06-04 DIAGNOSIS — Z7189 Other specified counseling: Secondary | ICD-10-CM

## 2023-06-04 DIAGNOSIS — R351 Nocturia: Secondary | ICD-10-CM

## 2023-06-04 DIAGNOSIS — Z0000 Encounter for general adult medical examination without abnormal findings: Secondary | ICD-10-CM

## 2023-06-04 DIAGNOSIS — I440 Atrioventricular block, first degree: Secondary | ICD-10-CM

## 2023-06-04 MED ORDER — ATORVASTATIN CALCIUM 40 MG PO TABS: ORAL_TABLET | ORAL | 4 refills | Status: DC

## 2023-06-04 MED ORDER — LOSARTAN POTASSIUM-HCTZ 100-25 MG PO TABS: 1.0000 | ORAL_TABLET | Freq: Every day | ORAL | 4 refills | Status: DC

## 2023-06-04 MED ORDER — SPIRONOLACTONE 25 MG PO TABS
25.0000 mg | ORAL_TABLET | Freq: Every day | ORAL | 3 refills | Status: DC
Start: 2023-06-04 — End: 2023-09-04

## 2023-06-04 NOTE — Progress Notes (Unsigned)
Ph: [phone] Fax: [phone]   Patient ID: Philip Middleton, male    DOB: [birth year], 67 y.o.   MRN: [id number]  This visit was conducted in person.  BP (!) 162/84   Pulse 87   Temp (!) 97.5 F (36.4 C) (Temporal)   Ht 5' 9.5" (1.765 m)   Wt (!) 336 lb 4 oz (152.5 kg)   SpO2 97%   BMI 48.94 kg/m   BP Readings from Last 3 Encounters:  06/04/23 (!) 162/84  03/26/23 (!) 158/62  09/26/22 (!) 144/110  BP elevated on repeat testitng  CC: CPE Subjective:   HPI: Philip Middleton is a 67 y.o. male presenting on 06/04/2023 for Annual Exam (MCR prt 2 [AWV- 03/26/23].)   Saw health advisor 02/2023 for medicare wellness visit. Note reviewed.    No results found.  Flowsheet Row Clinical Support from 03/26/2023 in [facility] [place]  PHQ-2 Total Score 0          03/26/2023    9:19 AM 03/23/2022   10:48 AM 03/21/2021    2:30 PM  Fall Risk   Falls in the past year? 0 0 0  Number falls in past yr: 0 0   Injury with Fall? 0 0   Risk for fall due to : No Fall Risks No Fall Risks   Follow up Falls prevention discussed;Education provided      HTN - continues hyzaar 100/25mg  daily. H/o pulmonary embolism, with residual chronic dyspnea and leg swelling. BP remaining elevated despite this.   Takes a few aleve per week.   Preventative: COLONOSCOPY 06/2022 - HP, int hem, rpt 5 yrs Leone Payor)  Prostate screening - maternal uncle with prostate cancer. Checks PSA yearly.  Lung cancer screening - not eligible  Flu shot yearly  COVID vaccine Pfizer 04/2020 x2 , no booster Td 2012  Prevnar-20 02/2021  Shingrix - discussed.  Advanced directives - doesn't have this set up. Unsure who HCPOA would be - possibly sister. Advanced directive packet provided today. Full code, ok with feeding tube. Wouldn't want prolonged life support if terminal condition.  Sleep - averaging 6 hours/night  Seat belt use discussed Sunscreen use discussed. No changing moles on skin   Non smoker  Alcohol - socially - not daily, max 4-5 drinks/week  Dentist Q6 mo - has not seen. Brushing teeth daily, some flossing, has a broken tooth.  Eye exam yearly - no glaucoma  Bowel - no constipation Bladder - no incontinence  Caffeine: 2-3 cups coffee/day   Lives alone, no pets, 1 child   Has sisters in [place] and [place]  Occupation: Press photographer, warehouse   Activity: no regular exercise  Diet: good water, daily fruits/vegetables      Relevant past medical, surgical, family and social history reviewed and updated as indicated. Interim medical history since our last visit reviewed. Allergies and medications reviewed and updated. Outpatient Medications Prior to Visit  Medication Sig Dispense Refill   aspirin 81 MG EC tablet Take 1 tablet (81 mg total) by mouth daily. Swallow whole.     Multiple Vitamin (MULTIVITAMIN) tablet Take 1 tablet by mouth daily.     naproxen sodium (ALEVE) 220 MG tablet Take 2 tablets (440 mg total) by mouth daily as needed.     Omega-3 Fatty Acids (FISH OIL) 1000 MG CAPS Take 1 capsule (1,000 mg total) by mouth daily.     sildenafil (VIAGRA) 100 MG tablet Take 0.5-1 tablets (50-100 mg total)  by mouth daily as needed for erectile dysfunction. 5 tablet 11   atorvastatin (LIPITOR) 40 MG tablet TAKE 1 TABLET BY MOUTH DAILY AT 6 PM. 90 tablet 3   losartan-hydrochlorothiazide (HYZAAR) 100-25 MG tablet Take 1 tablet by mouth daily. 90 tablet 2   No facility-administered medications prior to visit.     Per HPI unless specifically indicated in ROS section below Review of Systems  Constitutional:  Negative for activity change, appetite change, chills, fatigue, fever and unexpected weight change.  HENT:  Negative for hearing loss.   Eyes:  Negative for visual disturbance.  Respiratory:  Positive for cough (occ). Negative for chest tightness, shortness of breath and wheezing.   Cardiovascular:  Positive for leg swelling (chronic). Negative for chest pain  and palpitations.  Gastrointestinal:  Negative for abdominal distention, abdominal pain, blood in stool, constipation, diarrhea, nausea and vomiting.  Genitourinary:  Negative for difficulty urinating and hematuria.  Musculoskeletal:  Negative for arthralgias, myalgias and neck pain.  Skin:  Negative for rash.  Neurological:  Negative for dizziness, seizures, syncope and headaches.  Hematological:  Negative for adenopathy. Does not bruise/bleed easily.  Psychiatric/Behavioral:  Negative for dysphoric mood. The patient is not nervous/anxious.     Objective:  BP (!) 162/84   Pulse 87   Temp (!) 97.5 F (36.4 C) (Temporal)   Ht 5' 9.5" (1.765 m)   Wt (!) 336 lb 4 oz (152.5 kg)   SpO2 97%   BMI 48.94 kg/m   Wt Readings from Last 3 Encounters:  06/04/23 (!) 336 lb 4 oz (152.5 kg)  03/26/23 (!) 340 lb 6.4 oz (154.4 kg)  09/26/22 (!) 341 lb 6 oz (154.8 kg)      Physical Exam Vitals and nursing note reviewed.  Constitutional:      General: He is not in acute distress.    Appearance: Normal appearance. He is well-developed. He is not ill-appearing.  HENT:     Head: Normocephalic and atraumatic.     Right Ear: Hearing, tympanic membrane, ear canal and external ear normal.     Left Ear: Hearing, tympanic membrane, ear canal and external ear normal.     Mouth/Throat:     Mouth: Mucous membranes are moist.     Pharynx: Oropharynx is clear. No oropharyngeal exudate or posterior oropharyngeal erythema.  Eyes:     General: No scleral icterus.    Extraocular Movements: Extraocular movements intact.     Conjunctiva/sclera: Conjunctivae normal.     Pupils: Pupils are equal, round, and reactive to light.  Neck:     Thyroid: No thyroid mass or thyromegaly.     Vascular: No carotid bruit.  Cardiovascular:     Rate and Rhythm: Normal rate and regular rhythm.     Pulses: Normal pulses.          Radial pulses are 2+ on the right side and 2+ on the left side.     Heart sounds: Normal heart  sounds. No murmur heard. Pulmonary:     Effort: Pulmonary effort is normal. No respiratory distress.     Breath sounds: Normal breath sounds. No wheezing, rhonchi or rales.  Abdominal:     General: Bowel sounds are normal. There is no distension.     Palpations: Abdomen is soft. There is no mass.     Tenderness: There is no abdominal tenderness. There is no guarding or rebound.     Hernia: No hernia is present.  Musculoskeletal:  General: Swelling present. Normal range of motion.     Cervical back: Normal range of motion and neck supple.     Right lower leg: Edema present.     Left lower leg: Edema present.     Comments:  Chronic leg swelling Positive stemmer sign  Lymphadenopathy:     Cervical: No cervical adenopathy.  Skin:    General: Skin is warm and dry.     Findings: No rash.  Neurological:     General: No focal deficit present.     Mental Status: He is alert and oriented to person, place, and time.  Psychiatric:        Mood and Affect: Mood normal.        Behavior: Behavior normal.        Thought Content: Thought content normal.        Judgment: Judgment normal.       Results for orders placed or performed in visit on 05/28/23  CBC with Differential/Platelet  Result Value Ref Range   WBC 8.5 4.0 - 10.5 K/uL   RBC 4.08 (L) 4.22 - 5.81 Mil/uL   Hemoglobin 13.6 13.0 - 17.0 g/dL   HCT 16.1 09.6 - 04.5 %   MCV 101.5 (H) 78.0 - 100.0 fl   MCHC 32.8 30.0 - 36.0 g/dL   RDW 40.9 81.1 - 91.4 %   Platelets 210.0 150.0 - 400.0 K/uL   Neutrophils Relative % 58.6 43.0 - 77.0 %   Lymphocytes Relative 31.3 12.0 - 46.0 %   Monocytes Relative 8.2 3.0 - 12.0 %   Eosinophils Relative 1.1 0.0 - 5.0 %   Basophils Relative 0.8 0.0 - 3.0 %   Neutro Abs 5.0 1.4 - 7.7 K/uL   Lymphs Abs 2.6 0.7 - 4.0 K/uL   Monocytes Absolute 0.7 0.1 - 1.0 K/uL   Eosinophils Absolute 0.1 0.0 - 0.7 K/uL   Basophils Absolute 0.1 0.0 - 0.1 K/uL  TSH  Result Value Ref Range   TSH 1.27 0.35 -  5.50 uIU/mL  Uric acid  Result Value Ref Range   Uric Acid, Serum 7.7 4.0 - 7.8 mg/dL  PSA  Result Value Ref Range   PSA 1.09 0.10 - 4.00 ng/mL  Hemoglobin A1c  Result Value Ref Range   Hgb A1c MFr Bld 5.8 4.6 - 6.5 %  Comprehensive metabolic panel  Result Value Ref Range   Sodium 140 135 - 145 mEq/L   Potassium 4.2 3.5 - 5.1 mEq/L   Chloride 100 96 - 112 mEq/L   CO2 30 19 - 32 mEq/L   Glucose, Bld 96 70 - 99 mg/dL   BUN 17 6 - 23 mg/dL   Creatinine, Ser 7.82 0.40 - 1.50 mg/dL   Total Bilirubin 0.6 0.2 - 1.2 mg/dL   Alkaline Phosphatase 60 39 - 117 U/L   AST 15 0 - 37 U/L   ALT 15 0 - 53 U/L   Total Protein 6.9 6.0 - 8.3 g/dL   Albumin 4.0 3.5 - 5.2 g/dL   GFR 95.62 >13.08 mL/min   Calcium 9.6 8.4 - 10.5 mg/dL  Lipid panel  Result Value Ref Range   Cholesterol 181 0 - 200 mg/dL   Triglycerides 65.7 0.0 - 149.0 mg/dL   HDL 84.69 >62.95 mg/dL   VLDL 28.4 0.0 - 13.2 mg/dL   LDL Cholesterol 86 0 - 99 mg/dL   Total CHOL/HDL Ratio 2    NonHDL 105.37    Echocardiogram 10/2022 - LVEF 55-60%, normal  wall motion, mild LVH, G1DD, LA mod dilated, MV/AV normal, unable to assess PA pressure.   Assessment & Plan:   Problem List Items Addressed This Visit     Healthcare maintenance - Primary (Chronic)    Preventative protocols reviewed and updated unless pt declined. Discussed healthy diet and lifestyle.       Advanced directives, counseling/discussion (Chronic)    Advanced directives - doesn't have this set up. Unsure who HCPOA would be - possibly sister. Advanced directive packet provided today. Full code, ok with feeding tube. Wouldn't want prolonged life support if terminal condition.       HLD (hyperlipidemia)    Chronic, stable on atorvastatin - continue. The 10-year ASCVD risk score (Arnett DK, et al., 2019) is: 23.2%   Values used to calculate the score:     Age: 56 years     Sex: Male     Is Non-Hispanic African American: Yes     Diabetic: No     Tobacco smoker:  No     Systolic Blood Pressure: 162 mmHg     Is BP treated: Yes     HDL Cholesterol: 75.4 mg/dL     Total Cholesterol: 181 mg/dL       Relevant Medications   atorvastatin (LIPITOR) 40 MG tablet   losartan-hydrochlorothiazide (HYZAAR) 100-25 MG tablet   spironolactone (ALDACTONE) 25 MG tablet   Severe obesity (BMI >= 40) (HCC)    Continue to encourage healthy diet choices.       Essential hypertension    Chronic, BP above goal despite regularly taking full dose Hyzaar 100/25.  Will add spironolactone 25mg  daily, discussed returning in 1 wk for bmp.  RTC 3 months HTN f/u       Relevant Medications   atorvastatin (LIPITOR) 40 MG tablet   losartan-hydrochlorothiazide (HYZAAR) 100-25 MG tablet   spironolactone (ALDACTONE) 25 MG tablet   Other Relevant Orders   Basic metabolic panel   History of pulmonary embolism    H/o this 2009. Completed negative hypercoagulability workup.  Only on daily aspirin preventatively.       Prediabetes    Improved-continue to encourage limiting added sugars in diet.      Benign prostatic hyperplasia    PSA remains stable.      Primary osteoarthritis of left hip   Podagra    No recent gout flare.  Normally manages with as needed Aleve over-the-counter.      Primary osteoarthritis of right knee   First degree AV block    Avoid AV nodal blocking agents.      Relevant Medications   atorvastatin (LIPITOR) 40 MG tablet   losartan-hydrochlorothiazide (HYZAAR) 100-25 MG tablet   spironolactone (ALDACTONE) 25 MG tablet   Lymphedema    Chronic, overall some improvement since starting hydrochlorothiazide.  Unable to wear compression stockings due to lower extremity limitations from knee and hip arthritis.  Will refer to occupational therapy lymphedema clinic for further evaluation, recommendations.      Relevant Orders   Ambulatory referral to Occupational Therapy   Macrocytosis    New - check B12 and folate next labs.       Relevant  Orders   Vitamin B12   Folate     Meds ordered this encounter  Medications   atorvastatin (LIPITOR) 40 MG tablet    Sig: TAKE 1 TABLET BY MOUTH DAILY AT 6 PM.    Dispense:  90 tablet    Refill:  4   losartan-hydrochlorothiazide (HYZAAR) 100-25  MG tablet    Sig: Take 1 tablet by mouth daily.    Dispense:  90 tablet    Refill:  4   spironolactone (ALDACTONE) 25 MG tablet    Sig: Take 1 tablet (25 mg total) by mouth daily.    Dispense:  30 tablet    Refill:  3    Orders Placed This Encounter  Procedures   Vitamin B12    Standing Status:   Future    Standing Expiration Date:   06/04/2024   Folate    Standing Status:   Future    Standing Expiration Date:   06/04/2024   Basic metabolic panel    Standing Status:   Future    Standing Expiration Date:   06/04/2024   Ambulatory referral to Occupational Therapy    Referral Priority:   Routine    Referral Type:   Occupational Therapy    Referral Reason:   Specialty Services Required    Requested Specialty:   Occupational Therapy    Number of Visits Requested:   1    Patient Instructions  If interested, check with pharmacy about new 2 shot shingles series (shingrix).  Schedule dentist appointment.  Advanced directive packet provided today  BP remaining too high - by BP cuff to check at home.  Continue losartan hydrochlorothiazide, start spironolactone 25mg  daily, return in 1 week for lab visit only (non-fasting) to recheck kidneys and potassium.  I will investigate into lymphedema clinic in [place].  Return in 3-4 months for blood pressure follow up visit   Follow up plan: Return in about 3 months (around 09/04/2023) for follow up visit.  Eustaquio Boyden, MD

## 2023-06-04 NOTE — Patient Instructions (Addendum)
If interested, check with pharmacy about new 2 shot shingles series (shingrix).  Schedule dentist appointment.  Advanced directive packet provided today  BP remaining too high - by BP cuff to check at home.  Continue losartan hydrochlorothiazide, start spironolactone 25mg  daily, return in 1 week for lab visit only (non-fasting) to recheck kidneys and potassium.  I will investigate into lymphedema clinic in [place].  Return in 3-4 months for blood pressure follow up visit

## 2023-06-04 NOTE — Assessment & Plan Note (Signed)
Chronic, stable on atorvastatin - continue. The 10-year ASCVD risk score (Arnett DK, et al., 2019) is: 23.2%   Values used to calculate the score:     Age: 67 years     Sex: Male     Is Non-Hispanic African American: Yes     Diabetic: No     Tobacco smoker: No     Systolic Blood Pressure: 162 mmHg     Is BP treated: Yes     HDL Cholesterol: 75.4 mg/dL     Total Cholesterol: 181 mg/dL

## 2023-06-04 NOTE — Assessment & Plan Note (Signed)
Preventative protocols reviewed and updated unless pt declined. Discussed healthy diet and lifestyle.  

## 2023-06-04 NOTE — Assessment & Plan Note (Signed)
Advanced directives - doesn't have this set up. Unsure who HCPOA would be - possibly sister. Advanced directive packet provided today. Full code, ok with feeding tube. Wouldn't want prolonged life support if terminal condition.

## 2023-06-05 DIAGNOSIS — D7589 Other specified diseases of blood and blood-forming organs: Secondary | ICD-10-CM | POA: Insufficient documentation

## 2023-06-05 NOTE — Assessment & Plan Note (Signed)
Improved-continue to encourage limiting added sugars in diet.

## 2023-06-05 NOTE — Assessment & Plan Note (Signed)
PSA remains stable

## 2023-06-05 NOTE — Addendum Note (Signed)
Addended by: Eustaquio Boyden on: 06/05/2023 07:34 AM   Modules accepted: Level of Service

## 2023-06-05 NOTE — Assessment & Plan Note (Signed)
New - check B12 and folate next labs.

## 2023-06-05 NOTE — Assessment & Plan Note (Signed)
H/o this 2009. Completed negative hypercoagulability workup.  Only on daily aspirin preventatively.

## 2023-06-05 NOTE — Assessment & Plan Note (Signed)
Avoid AV nodal blocking agents.

## 2023-06-05 NOTE — Assessment & Plan Note (Addendum)
Chronic, BP above goal despite regularly taking full dose Hyzaar 100/25.  Will add spironolactone 25mg  daily, discussed returning in 1 wk for bmp.  RTC 3 months HTN f/u

## 2023-06-05 NOTE — Assessment & Plan Note (Signed)
No recent gout flare.  Normally manages with as needed Aleve over-the-counter.

## 2023-06-05 NOTE — Assessment & Plan Note (Addendum)
Chronic, overall some improvement since starting hydrochlorothiazide.  Unable to wear compression stockings due to lower extremity limitations from knee and hip arthritis.  Will refer to occupational therapy lymphedema clinic for further evaluation, recommendations.

## 2023-06-05 NOTE — Assessment & Plan Note (Signed)
Continue to encourage healthy diet choices.  

## 2023-06-10 ENCOUNTER — Other Ambulatory Visit (INDEPENDENT_AMBULATORY_CARE_PROVIDER_SITE_OTHER): Payer: Medicare Other

## 2023-06-10 DIAGNOSIS — D7589 Other specified diseases of blood and blood-forming organs: Secondary | ICD-10-CM

## 2023-06-10 DIAGNOSIS — I10 Essential (primary) hypertension: Secondary | ICD-10-CM | POA: Diagnosis not present

## 2023-06-11 LAB — BASIC METABOLIC PANEL
BUN: 19 mg/dL (ref 6–23)
CO2: 29 mEq/L (ref 19–32)
Calcium: 9.8 mg/dL (ref 8.4–10.5)
Chloride: 102 mEq/L (ref 96–112)
Creatinine, Ser: 1.18 mg/dL (ref 0.40–1.50)
GFR: 63.42 mL/min (ref >60.00)
Glucose, Bld: 95 mg/dL (ref 70–99)
Potassium: 4.1 mEq/L (ref 3.5–5.1)
Sodium: 140 mEq/L (ref 135–145)

## 2023-06-11 LAB — VITAMIN B12: Vitamin B-12: 248 pg/mL (ref 211–911)

## 2023-06-11 LAB — FOLATE: Folate: 15.6 ng/mL (ref >5.9)

## 2023-06-21 ENCOUNTER — Encounter: Payer: Self-pay | Admitting: Family Medicine

## 2023-06-21 ENCOUNTER — Other Ambulatory Visit: Payer: Self-pay | Admitting: Family Medicine

## 2023-06-21 DIAGNOSIS — E538 Deficiency of other specified B group vitamins: Secondary | ICD-10-CM | POA: Insufficient documentation

## 2023-06-21 DIAGNOSIS — D7589 Other specified diseases of blood and blood-forming organs: Secondary | ICD-10-CM

## 2023-06-21 MED ORDER — VITAMIN B-12 1000 MCG PO TABS: 1000.0000 ug | ORAL_TABLET | Freq: Every day | ORAL | Status: AC

## 2023-08-28 DIAGNOSIS — M1711 Unilateral primary osteoarthritis, right knee: Secondary | ICD-10-CM | POA: Diagnosis not present

## 2023-08-29 DIAGNOSIS — M1612 Unilateral primary osteoarthritis, left hip: Secondary | ICD-10-CM | POA: Diagnosis not present

## 2023-09-04 ENCOUNTER — Encounter: Payer: Self-pay | Admitting: Family Medicine

## 2023-09-04 ENCOUNTER — Ambulatory Visit (INDEPENDENT_AMBULATORY_CARE_PROVIDER_SITE_OTHER): Payer: Medicare Other | Admitting: Family Medicine

## 2023-09-04 VITALS — BP 148/90 | HR 65 | Temp 98.1°F | Ht 69.5 in | Wt 340.1 lb

## 2023-09-04 DIAGNOSIS — Z23 Encounter for immunization: Secondary | ICD-10-CM | POA: Diagnosis not present

## 2023-09-04 DIAGNOSIS — I10 Essential (primary) hypertension: Secondary | ICD-10-CM

## 2023-09-04 MED ORDER — SPIRONOLACTONE 50 MG PO TABS: 50.0000 mg | ORAL_TABLET | Freq: Every day | ORAL | 3 refills | Status: DC

## 2023-09-04 NOTE — Patient Instructions (Addendum)
Flu shot today  BP is better but staying elevated - increase spironolactone to 50mg  daily - new dose at pharmacy. Continue Hyzaar 100/25mg  daily.  Schedule lab visit in 1 week to recheck potassium and kidneys Return in 3 months for follow up visit hypertension Consider buying BP cuff to check pressures at home.

## 2023-09-04 NOTE — Assessment & Plan Note (Signed)
Chronic, BP control improved on spironolactone but staying elevated - will increase to 50mg  daily. He's tolerated 25mg  dose well. RTC 1 wk lab visit only (BMP). Continue Hyzaar.

## 2023-09-04 NOTE — Addendum Note (Signed)
Addended by: Shon Millet on: 09/04/2023 11:03 AM   Modules accepted: Orders

## 2023-09-04 NOTE — Progress Notes (Signed)
Ph: [phone] Fax: [phone]   Patient ID: Philip Middleton, male    DOB: January 18, 1956, 67 y.o.   MRN: [id number]  This visit was conducted in person.  BP (!) 148/90 (BP Location: Left Arm, Patient Position: Sitting, Cuff Size: Large)   Pulse 65   Temp 98.1 F (36.7 C) (Oral)   Ht 5' 9.5" (1.765 m)   Wt (!) 340 lb 2 oz (154.3 kg)   SpO2 99%   BMI 49.51 kg/m   R arm 148/114 L arm 152/98  CC: 3 mo HTN f/u visit  Subjective:   HPI: Philip Middleton is a 67 y.o. male presenting on 09/04/2023 for Medical Management of Chronic Issues (3 Month HTN f/u)   HTN - Compliant with current antihypertensive regimen of hyzaar 100/25mg  daily, spironolactone 25mg  daily - recently started. Tolerating well without gynecomastia. Does not check blood pressures at home.  No low blood pressure readings or symptoms of dizziness/syncope. Denies HA, vision changes, CP/tightness, SOB. Notes improvement in chronic leg swelling.    Last week had steroid injection into left knee/right hip through [facility] Dr Dion Saucier.      Relevant past medical, surgical, family and social history reviewed and updated as indicated. Interim medical history since our last visit reviewed. Allergies and medications reviewed and updated. Outpatient Medications Prior to Visit  Medication Sig Dispense Refill   aspirin 81 MG EC tablet Take 1 tablet (81 mg total) by mouth daily. Swallow whole.     atorvastatin (LIPITOR) 40 MG tablet TAKE 1 TABLET BY MOUTH DAILY AT 6 PM. 90 tablet 4   cyanocobalamin (VITAMIN B12) 1000 MCG tablet Take 1 tablet (1,000 mcg total) by mouth daily.     losartan-hydrochlorothiazide (HYZAAR) 100-25 MG tablet Take 1 tablet by mouth daily. 90 tablet 4   Multiple Vitamin (MULTIVITAMIN) tablet Take 1 tablet by mouth daily.     naproxen sodium (ALEVE) 220 MG tablet Take 2 tablets (440 mg total) by mouth daily as needed.     Omega-3 Fatty Acids (FISH OIL) 1000 MG CAPS Take 1 capsule (1,000 mg total) by mouth  daily.     sildenafil (VIAGRA) 100 MG tablet Take 0.5-1 tablets (50-100 mg total) by mouth daily as needed for erectile dysfunction. 5 tablet 11   spironolactone (ALDACTONE) 25 MG tablet Take 1 tablet (25 mg total) by mouth daily. 30 tablet 3   No facility-administered medications prior to visit.     Per HPI unless specifically indicated in ROS section below Review of Systems  Objective:  BP (!) 148/90 (BP Location: Left Arm, Patient Position: Sitting, Cuff Size: Large)   Pulse 65   Temp 98.1 F (36.7 C) (Oral)   Ht 5' 9.5" (1.765 m)   Wt (!) 340 lb 2 oz (154.3 kg)   SpO2 99%   BMI 49.51 kg/m   Wt Readings from Last 3 Encounters:  09/04/23 (!) 340 lb 2 oz (154.3 kg)  06/04/23 (!) 336 lb 4 oz (152.5 kg)  03/26/23 (!) 340 lb 6.4 oz (154.4 kg)      Physical Exam Vitals and nursing note reviewed.  Constitutional:      Appearance: Normal appearance. He is not ill-appearing.  HENT:     Head: Normocephalic and atraumatic.     Mouth/Throat:     Mouth: Mucous membranes are moist.     Pharynx: Oropharynx is clear. No oropharyngeal exudate or posterior oropharyngeal erythema.  Eyes:     Extraocular Movements: Extraocular movements intact.  Conjunctiva/sclera: Conjunctivae normal.     Pupils: Pupils are equal, round, and reactive to light.  Cardiovascular:     Rate and Rhythm: Normal rate and regular rhythm.     Pulses: Normal pulses.     Heart sounds: Normal heart sounds. No murmur heard. Pulmonary:     Effort: Pulmonary effort is normal. No respiratory distress.     Breath sounds: Normal breath sounds. No wheezing, rhonchi or rales.  Musculoskeletal:        General: Swelling present.     Right lower leg: Edema present.     Left lower leg: Edema present.     Comments: Nonpitting edema bilaterally  Neurological:     Mental Status: He is alert.  Psychiatric:        Mood and Affect: Mood normal.        Behavior: Behavior normal.       Results for orders placed or  performed in visit on 06/10/23  Basic metabolic panel  Result Value Ref Range   Sodium 140 135 - 145 mEq/L   Potassium 4.1 3.5 - 5.1 mEq/L   Chloride 102 96 - 112 mEq/L   CO2 29 19 - 32 mEq/L   Glucose, Bld 95 70 - 99 mg/dL   BUN 19 6 - 23 mg/dL   Creatinine, Ser 1.61 0.40 - 1.50 mg/dL   GFR 09.60 >45.40 mL/min   Calcium 9.8 8.4 - 10.5 mg/dL  Folate  Result Value Ref Range   Folate 15.6 >5.9 ng/mL  Vitamin B12  Result Value Ref Range   Vitamin B-12 248 211 - 911 pg/mL    Assessment & Plan:   Problem List Items Addressed This Visit     Essential hypertension - Primary    Chronic, BP control improved on spironolactone but staying elevated - will increase to 50mg  daily. He's tolerated 25mg  dose well. RTC 1 wk lab visit only (BMP). Continue Hyzaar.       Relevant Medications   spironolactone (ALDACTONE) 50 MG tablet   Other Relevant Orders   Basic Metabolic Panel     Meds ordered this encounter  Medications   spironolactone (ALDACTONE) 50 MG tablet    Sig: Take 1 tablet (50 mg total) by mouth daily.    Dispense:  30 tablet    Refill:  3    Note new dose    Orders Placed This Encounter  Procedures   Basic Metabolic Panel    Standing Status:   Future    Standing Expiration Date:   09/03/2024    Patient Instructions  Flu shot today  BP is better but staying elevated - increase spironolactone to 50mg  daily - new dose at pharmacy. Continue Hyzaar 100/25mg  daily.  Schedule lab visit in 1 week to recheck potassium and kidneys Return in 3 months for follow up visit hypertension Consider buying BP cuff to check pressures at home.   Follow up plan: Return in about 3 months (around 12/05/2023) for follow up visit.  Eustaquio Boyden, MD

## 2023-09-11 ENCOUNTER — Other Ambulatory Visit (INDEPENDENT_AMBULATORY_CARE_PROVIDER_SITE_OTHER): Payer: Medicare Other

## 2023-09-11 DIAGNOSIS — I10 Essential (primary) hypertension: Secondary | ICD-10-CM

## 2023-09-11 LAB — BASIC METABOLIC PANEL
BUN: 27 mg/dL — ABNORMAL HIGH (ref 6–23)
CO2: 30 meq/L (ref 19–32)
Calcium: 9.8 mg/dL (ref 8.4–10.5)
Chloride: 100 meq/L (ref 96–112)
Creatinine, Ser: 1.38 mg/dL (ref 0.40–1.50)
GFR: 52.46 mL/min — ABNORMAL LOW (ref >60.00)
Glucose, Bld: 97 mg/dL (ref 70–99)
Potassium: 4.1 meq/L (ref 3.5–5.1)
Sodium: 140 meq/L (ref 135–145)

## 2023-09-13 ENCOUNTER — Telehealth: Payer: Self-pay | Admitting: Family Medicine

## 2023-09-13 ENCOUNTER — Telehealth: Payer: Self-pay

## 2023-09-13 ENCOUNTER — Other Ambulatory Visit: Payer: Self-pay | Admitting: Family Medicine

## 2023-09-13 MED ORDER — SPIRONOLACTONE 25 MG PO TABS: 25.0000 mg | ORAL_TABLET | Freq: Every day | ORAL | 6 refills | Status: DC

## 2023-09-13 NOTE — Telephone Encounter (Addendum)
Lvm asking pt to call back. Need to relay lab results and Dr Timoteo Expose message (see Labs, Result Notes- 09/11/23).   Labs/Dr G's msg: Your kidney function returned mildly worse. I recommend you return to spironolactone 25 mg (take 1/2 tab of 50 mg dose until you finish with current bottle). Then contact the pharmacy and ask them to fill the new prescription of 25 mg tablets.

## 2023-09-13 NOTE — Telephone Encounter (Signed)
Spoke with pt. See other 09/13/23 phn note.

## 2023-09-13 NOTE — Telephone Encounter (Signed)
Pt called in and stated he was returning a call and would like a call back if possible.

## 2023-09-13 NOTE — Telephone Encounter (Signed)
Pt rtn call.    I relayed results and Dr. G's message.  Pt verbalizes understanding.  

## 2023-12-09 ENCOUNTER — Ambulatory Visit: Payer: Medicare Other | Admitting: Family Medicine

## 2023-12-11 ENCOUNTER — Ambulatory Visit: Payer: Medicare Other | Admitting: Family Medicine

## 2024-03-04 DIAGNOSIS — M1612 Unilateral primary osteoarthritis, left hip: Secondary | ICD-10-CM | POA: Diagnosis not present

## 2024-03-23 DIAGNOSIS — M1612 Unilateral primary osteoarthritis, left hip: Secondary | ICD-10-CM | POA: Diagnosis not present

## 2024-03-26 ENCOUNTER — Ambulatory Visit: Payer: Medicare Other

## 2024-03-26 VITALS — Ht 71.0 in | Wt 340.0 lb

## 2024-03-26 DIAGNOSIS — Z0000 Encounter for general adult medical examination without abnormal findings: Secondary | ICD-10-CM | POA: Diagnosis not present

## 2024-03-26 NOTE — Progress Notes (Signed)
 Please attest and cosign this visit due to patients primary care provider not being in the office at the time the visit was completed.    Subjective:   Philip Middleton is a 68 y.o. who presents for a Medicare Wellness preventive visit.  Visit Complete: Virtual I connected with  Roben E Whelpley on 03/26/24 by a audio enabled telemedicine application and verified that I am speaking with the correct person using two identifiers.  Patient Location: Home  Provider Location: Office/Clinic  I discussed the limitations of evaluation and management by telemedicine. The patient expressed understanding and agreed to proceed.  Vital Signs: Because this visit was a virtual/telehealth visit, some criteria may be missing or patient reported. Any vitals not documented were not able to be obtained and vitals that have been documented are patient reported.  VideoDeclined- This patient declined Librarian, academic. Therefore the visit was completed with audio only.  Persons Participating in Visit: Patient.  AWV Questionnaire: No: Patient Medicare AWV questionnaire was not completed prior to this visit.  Cardiac Risk Factors include: advanced age (>38men, >42 women);dyslipidemia;male gender;hypertension;sedentary lifestyle;obesity (BMI >30kg/m2)     Objective:    Today's Vitals   03/26/24 0850  Weight: (!) 340 lb (154.2 kg)  Height: 5\' 11"  (1.803 m)   Body mass index is 47.42 kg/m.     03/26/2024    8:59 AM 03/26/2023    9:36 AM 03/26/2023    9:28 AM 03/23/2022   10:50 AM 06/28/2017    2:04 PM  Advanced Directives  Does Patient Have a Medical Advance Directive? Yes No No No No  Type of Estate agent of [place];Living will      Copy of Healthcare Power of Attorney in Chart? No - copy requested      Would patient like information on creating a medical advance directive?  No - Patient declined No - Patient declined No - Patient declined      Current Medications (verified) Outpatient Encounter Medications as of 03/26/2024  Medication Sig   aspirin  81 MG EC tablet Take 1 tablet (81 mg total) by mouth daily. Swallow whole.   atorvastatin  (LIPITOR) 40 MG tablet TAKE 1 TABLET BY MOUTH DAILY AT 6 PM.   cyanocobalamin  (VITAMIN B12) 1000 MCG tablet Take 1 tablet (1,000 mcg total) by mouth daily.   losartan -hydrochlorothiazide (HYZAAR) 100-25 MG tablet Take 1 tablet by mouth daily.   Multiple Vitamin (MULTIVITAMIN) tablet Take 1 tablet by mouth daily.   naproxen  sodium (ALEVE ) 220 MG tablet Take 2 tablets (440 mg total) by mouth daily as needed.   Omega-3 Fatty Acids (FISH OIL ) 1000 MG CAPS Take 1 capsule (1,000 mg total) by mouth daily.   sildenafil  (VIAGRA ) 100 MG tablet Take 0.5-1 tablets (50-100 mg total) by mouth daily as needed for erectile dysfunction.   spironolactone  (ALDACTONE ) 25 MG tablet Take 1 tablet (25 mg total) by mouth daily.   No facility-administered encounter medications on file as of 03/26/2024.    Allergies (verified) Patient has no known allergies.   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/p 6+ mo coumadin   Hyperlipemia    Hypertension    left thumb ORIF    fracture   Peri-rectal abscess    repair x 4   Personal history of colonic adenomas    Past Surgical History:  Procedure Laterality Date   COLONOSCOPY  10/2013   3  diminutive polyps, rpt 3 yrs Willy Harvest)   COLONOSCOPY  06/2017   3 TA, rpt 3 yrs Willy Harvest)   COLONOSCOPY  06/2022   HP, int hem, rpt 5 yrs Teaching laboratory technician)   DENTAL RESTORATION/EXTRACTION WITH X-RAY     LE venous U/S nml  03/29/2009   L residual thrombus in SFV and poplitael veins   LE venous U/S nml  10/2009   no superficial or deep thrombus   ORIF FINGER / THUMB FRACTURE     Family History  Problem Relation Age of Onset   Cancer Mother        metastatic cancer, ? ovarian   Diabetes Sister    Sleep  apnea Brother    Cancer Maternal Uncle 17       prostate   Colon cancer Neg Hx    Esophageal cancer Neg Hx    Stomach cancer Neg Hx    Social History   Socioeconomic History   Marital status: Single    Spouse name: Not on file   Number of children: Not on file   Years of education: Not on file   Highest education level: Not on file  Occupational History   Not on file  Tobacco Use   Smoking status: Former    Types: Cigars   Smokeless tobacco: Never   Tobacco comments:    smoked 1-2 daily, quit 2005  Vaping Use   Vaping status: Never Used  Substance and Sexual Activity   Alcohol use: Yes    Alcohol/week: 5.0 standard drinks of alcohol    Types: 5 Standard drinks or equivalent per week   Drug use: No   Sexual activity: Never  Other Topics Concern   Not on file  Social History Narrative   Caffeine: 2-3 cups coffee/day   Lives alone, no pets, 1 child   Occupation: Press photographer, warehouse   Activity: stays active at work, no regular exercise   Diet: good water, daily fruits/vegetables    Social Drivers of Corporate investment banker Strain: Low Risk  (03/26/2024)   Overall Financial Resource Strain (CARDIA)    Difficulty of Paying Living Expenses: Not hard at all  Food Insecurity: No Food Insecurity (03/26/2024)   Hunger Vital Sign    Worried About Running Out of Food in the Last Year: Never true    Ran Out of Food in the Last Year: Never true  Transportation Needs: No Transportation Needs (03/26/2024)   PRAPARE - Administrator, Civil Service (Medical): No    Lack of Transportation (Non-Medical): No  Physical Activity: Inactive (03/26/2024)   Exercise Vital Sign    Days of Exercise per Week: 0 days    Minutes of Exercise per Session: 0 min  Stress: No Stress Concern Present (03/26/2024)   [employer] of Occupational Health - Occupational Stress Questionnaire    Feeling of Stress : Not at all  Social Connections: Unknown (03/26/2024)   Social Connection and  Isolation Panel [NHANES]    Frequency of Communication with Friends and Family: More than three times a week    Frequency of Social Gatherings with Friends and Family: More than three times a week    Attends Religious Services: Never    Database administrator or Organizations: Yes    Attends Banker Meetings: 1 to 4 times per year    Marital Status: Not on file    Tobacco Counseling Counseling given: Not Answered Tobacco comments: smoked 1-2 daily, quit 2005  Clinical Intake:  Pre-visit preparation completed: Yes  Pain : No/denies pain     BMI - recorded: 47.42 Nutritional Status: BMI > 30  Obese Nutritional Risks: None Diabetes: No  Lab Results  Component Value Date   HGBA1C 5.8 05/28/2023   HGBA1C 6.1 03/19/2022   HGBA1C 6.1 03/14/2021     How often do you need to have someone help you when you read instructions, pamphlets, or other written materials from your doctor or pharmacy?: 1 - Never  Interpreter Needed?: No  Comments: lives alone Information entered by :: B.Keijuan Schellhase,LPN   Activities of Daily Living     03/26/2024    9:00 AM  In your present state of health, do you have any difficulty performing the following activities:  Hearing? 0  Vision? 0  Difficulty concentrating or making decisions? 0  Walking or climbing stairs? 1  Dressing or bathing? 0  Doing errands, shopping? 0  Preparing Food and eating ? N  Using the Toilet? N  In the past six months, have you accidently leaked urine? N  Do you have problems with loss of bowel control? N  Managing your Medications? N  Managing your Finances? N  Housekeeping or managing your Housekeeping? N    Patient Care Team: Claire Crick, MD as PCP - General (Family Medicine)  Indicate any recent Medical Services you may have received from other than [facility] providers in the past year (date may be approximate).     Assessment:   This is a routine wellness examination for  Jagjit.  Hearing/Vision screen Hearing Screening - Comments:: Pt says hearing is good Vision Screening - Comments:: Pt says his vision is good w/glasses [facility]   Goals Addressed             This Visit's Progress    Patient Stated   Not on track    03/26/24-Go fishing, get hip and knee fixed       Depression Screen     03/26/2024    8:55 AM 03/26/2023    9:26 AM 03/23/2022   10:45 AM 03/21/2021    2:30 PM 09/12/2018    2:01 PM 08/27/2017    8:29 AM  PHQ 2/9 Scores  PHQ - 2 Score 0 0 0 0 0 1    Fall Risk     03/26/2024    8:52 AM 03/26/2023    9:19 AM 03/23/2022   10:48 AM 03/21/2021    2:30 PM  Fall Risk   Falls in the past year? 0 0 0 0  Number falls in past yr: 1 0 0   Injury with Fall? 1 0 0   Risk for fall due to : No Fall Risks No Fall Risks No Fall Risks   Follow up Falls prevention discussed;Education provided Falls prevention discussed;Education provided      MEDICARE RISK AT HOME:  Medicare Risk at Home Any stairs in or around the home?: No If so, are there any without handrails?: No Home free of loose throw rugs in walkways, pet beds, electrical cords, etc?: Yes Adequate lighting in your home to reduce risk of falls?: Yes Life alert?: No Use of a cane, walker or w/c?: Yes (cane) Grab bars in the bathroom?: No Shower chair or bench in shower?: Yes Elevated toilet seat or a handicapped toilet?: No  TIMED UP AND GO:  Was the test performed?  No  Cognitive Function: 6CIT completed        03/26/2024    9:01  AM 03/26/2023    9:34 AM 03/23/2022   10:50 AM  6CIT Screen  What Year? 0 points 0 points 0 points  What month? 0 points 0 points 0 points  What time? 0 points 0 points 0 points  Count back from 20 0 points 0 points 0 points  Months in reverse 0 points 0 points 0 points  Repeat phrase 0 points 0 points 0 points  Total Score 0 points 0 points 0 points    Immunizations Immunization History  Administered Date(s) Administered   Fluad Quad(high  Dose 65+) 09/26/2022   Fluad Trivalent(High Dose 65+) 09/04/2023   Influenza, High Dose Seasonal PF 10/05/2021   Influenza-Unspecified 08/26/2014, 09/09/2017   PFIZER(Purple Top)SARS-COV-2 Vaccination 04/26/2020, 05/20/2020   PNEUMOCOCCAL CONJUGATE-20 03/21/2021   Td 12/21/1997, 01/03/2011    Screening Tests Health Maintenance  Topic Date Due   Zoster Vaccines- Shingrix (1 of 2) Never done   DTaP/Tdap/Td (3 - Tdap) 09/03/2024 (Originally 01/03/2021)   COVID-19 Vaccine (3 - 2024-25 season) 09/19/2024 (Originally 07/28/2023)   INFLUENZA VACCINE  06/26/2024   Medicare Annual Wellness (AWV)  03/26/2025   Colonoscopy  06/28/2027   Pneumonia Vaccine 46+ Years old  Completed   Hepatitis C Screening  Completed   HPV VACCINES  Aged Out   Meningococcal B Vaccine  Aged Out    Health Maintenance  Health Maintenance Due  Topic Date Due   Zoster Vaccines- Shingrix (1 of 2) Never done   Health Maintenance Items Addressed:   Additional Screening:  Vision Screening: Recommended annual ophthalmology exams for early detection of glaucoma and other disorders of the eye.  Dental Screening: Recommended annual dental exams for proper oral hygiene  Community Resource Referral / Chronic Care Management: CRR required this visit?  No   CCM required this visit?  Appt scheduled with PCP    Plan:     I have personally reviewed and noted the following in the patient's chart:   Medical and social history Use of alcohol, tobacco or illicit drugs  Current medications and supplements including opioid prescriptions. Patient is not currently taking opioid prescriptions. Functional ability and status Nutritional status Physical activity Advanced directives List of other physicians Hospitalizations, surgeries, and ER visits in previous 12 months Vitals Screenings to include cognitive, depression, and falls Referrals and appointments  In addition, I have reviewed and discussed with patient certain  preventive protocols, quality metrics, and best practice recommendations. A written personalized care plan for preventive services as well as general preventive health recommendations were provided to patient.    Nerissa Bannister, LPN   12/01/1094   After Visit Summary: (MyChart) Due to this being a telephonic visit, the after visit summary with patients personalized plan was offered to patient via MyChart   Notes: Nothing significant to report at this time.

## 2024-03-26 NOTE — Patient Instructions (Signed)
 Philip Middleton , Thank you for taking time to come for your Medicare Wellness Visit. I appreciate your ongoing commitment to your health goals. Please review the following plan we discussed and let me know if I can assist you in the future.   Referrals/Orders/Follow-Ups/Clinician Recommendations: none  This is a list of the screening recommended for you and due dates:  Health Maintenance  Topic Date Due   Zoster (Shingles) Vaccine (1 of 2) Never done   DTaP/Tdap/Td vaccine (3 - Tdap) 09/03/2024*   COVID-19 Vaccine (3 - 2024-25 season) 09/19/2024*   Flu Shot  06/26/2024   Medicare Annual Wellness Visit  03/26/2025   Colon Cancer Screening  06/28/2027   Pneumonia Vaccine  Completed   Hepatitis C Screening  Completed   HPV Vaccine  Aged Out   Meningitis B Vaccine  Aged Out  *Topic was postponed. The date shown is not the original due date.    Advanced directives: (Declined) Advance directive discussed with you today. Even though you declined this today, please call our office should you change your mind, and we can give you the proper paperwork for you to fill out.  Next Medicare Annual Wellness Visit scheduled for next year: Yes 03/26/24 @ 8:50am televisit

## 2024-04-14 DIAGNOSIS — M160 Bilateral primary osteoarthritis of hip: Secondary | ICD-10-CM | POA: Diagnosis not present

## 2024-04-22 DIAGNOSIS — M160 Bilateral primary osteoarthritis of hip: Secondary | ICD-10-CM | POA: Diagnosis not present

## 2024-05-12 ENCOUNTER — Other Ambulatory Visit: Payer: Self-pay | Admitting: Family Medicine

## 2024-05-12 DIAGNOSIS — I10 Essential (primary) hypertension: Secondary | ICD-10-CM

## 2024-06-05 ENCOUNTER — Other Ambulatory Visit: Payer: Self-pay | Admitting: Family Medicine

## 2024-06-05 DIAGNOSIS — I10 Essential (primary) hypertension: Secondary | ICD-10-CM

## 2024-06-14 ENCOUNTER — Other Ambulatory Visit: Payer: Self-pay | Admitting: Family Medicine

## 2024-06-14 DIAGNOSIS — E785 Hyperlipidemia, unspecified: Secondary | ICD-10-CM

## 2024-06-14 DIAGNOSIS — R7303 Prediabetes: Secondary | ICD-10-CM

## 2024-06-14 DIAGNOSIS — N401 Enlarged prostate with lower urinary tract symptoms: Secondary | ICD-10-CM

## 2024-06-14 DIAGNOSIS — E538 Deficiency of other specified B group vitamins: Secondary | ICD-10-CM

## 2024-06-17 ENCOUNTER — Other Ambulatory Visit (INDEPENDENT_AMBULATORY_CARE_PROVIDER_SITE_OTHER)

## 2024-06-17 DIAGNOSIS — E785 Hyperlipidemia, unspecified: Secondary | ICD-10-CM | POA: Diagnosis not present

## 2024-06-17 DIAGNOSIS — R351 Nocturia: Secondary | ICD-10-CM | POA: Diagnosis not present

## 2024-06-17 DIAGNOSIS — E538 Deficiency of other specified B group vitamins: Secondary | ICD-10-CM

## 2024-06-17 DIAGNOSIS — N401 Enlarged prostate with lower urinary tract symptoms: Secondary | ICD-10-CM | POA: Diagnosis not present

## 2024-06-17 DIAGNOSIS — R7303 Prediabetes: Secondary | ICD-10-CM | POA: Diagnosis not present

## 2024-06-17 LAB — COMPREHENSIVE METABOLIC PANEL WITH GFR
ALT: 11 U/L (ref 0–53)
AST: 11 U/L (ref 0–37)
Albumin: 4.1 g/dL (ref 3.5–5.2)
Alkaline Phosphatase: 57 U/L (ref 39–117)
BUN: 31 mg/dL — ABNORMAL HIGH (ref 6–23)
CO2: 30 meq/L (ref 19–32)
Calcium: 9.7 mg/dL (ref 8.4–10.5)
Chloride: 102 meq/L (ref 96–112)
Creatinine, Ser: 1.55 mg/dL — ABNORMAL HIGH (ref 0.40–1.50)
GFR: 45.39 mL/min — ABNORMAL LOW (ref >60.00)
Glucose, Bld: 97 mg/dL (ref 70–99)
Potassium: 4.2 meq/L (ref 3.5–5.1)
Sodium: 141 meq/L (ref 135–145)
Total Bilirubin: 0.5 mg/dL (ref 0.2–1.2)
Total Protein: 6.8 g/dL (ref 6.0–8.3)

## 2024-06-17 LAB — LIPID PANEL
Cholesterol: 183 mg/dL (ref 0–200)
HDL: 73.8 mg/dL (ref >39.00)
LDL Cholesterol: 93 mg/dL (ref 0–99)
NonHDL: 109.39
Total CHOL/HDL Ratio: 2
Triglycerides: 82 mg/dL (ref 0.0–149.0)
VLDL: 16.4 mg/dL (ref 0.0–40.0)

## 2024-06-17 LAB — PSA: PSA: 1 ng/mL (ref 0.10–4.00)

## 2024-06-17 LAB — VITAMIN B12: Vitamin B-12: 212 pg/mL (ref 211–911)

## 2024-06-17 LAB — HEMOGLOBIN A1C: Hgb A1c MFr Bld: 6.1 % (ref 4.6–6.5)

## 2024-06-18 ENCOUNTER — Ambulatory Visit: Payer: Self-pay | Admitting: Family Medicine

## 2024-06-24 ENCOUNTER — Ambulatory Visit (INDEPENDENT_AMBULATORY_CARE_PROVIDER_SITE_OTHER): Admitting: Family Medicine

## 2024-06-24 ENCOUNTER — Encounter: Payer: Self-pay | Admitting: Family Medicine

## 2024-06-24 VITALS — BP 138/70 | HR 98 | Temp 99.5°F | Ht 70.0 in | Wt 331.5 lb

## 2024-06-24 DIAGNOSIS — Z7189 Other specified counseling: Secondary | ICD-10-CM

## 2024-06-24 DIAGNOSIS — N1831 Chronic kidney disease, stage 3a: Secondary | ICD-10-CM | POA: Diagnosis not present

## 2024-06-24 DIAGNOSIS — R7303 Prediabetes: Secondary | ICD-10-CM

## 2024-06-24 DIAGNOSIS — M109 Gout, unspecified: Secondary | ICD-10-CM

## 2024-06-24 DIAGNOSIS — Z86711 Personal history of pulmonary embolism: Secondary | ICD-10-CM

## 2024-06-24 DIAGNOSIS — Z0000 Encounter for general adult medical examination without abnormal findings: Secondary | ICD-10-CM | POA: Diagnosis not present

## 2024-06-24 DIAGNOSIS — I890 Lymphedema, not elsewhere classified: Secondary | ICD-10-CM

## 2024-06-24 DIAGNOSIS — E785 Hyperlipidemia, unspecified: Secondary | ICD-10-CM

## 2024-06-24 DIAGNOSIS — M1711 Unilateral primary osteoarthritis, right knee: Secondary | ICD-10-CM

## 2024-06-24 DIAGNOSIS — M1612 Unilateral primary osteoarthritis, left hip: Secondary | ICD-10-CM | POA: Diagnosis not present

## 2024-06-24 DIAGNOSIS — Z86718 Personal history of other venous thrombosis and embolism: Secondary | ICD-10-CM

## 2024-06-24 DIAGNOSIS — I10 Essential (primary) hypertension: Secondary | ICD-10-CM | POA: Diagnosis not present

## 2024-06-24 DIAGNOSIS — E538 Deficiency of other specified B group vitamins: Secondary | ICD-10-CM

## 2024-06-24 LAB — URINALYSIS, ROUTINE W REFLEX MICROSCOPIC
Bilirubin Urine: NEGATIVE
Hgb urine dipstick: NEGATIVE
Ketones, ur: NEGATIVE
Leukocytes,Ua: NEGATIVE
Nitrite: NEGATIVE
RBC / HPF: NONE SEEN (ref 0–?)
Specific Gravity, Urine: 1.015 (ref 1.000–1.030)
Total Protein, Urine: NEGATIVE
Urine Glucose: NEGATIVE
Urobilinogen, UA: 0.2 (ref 0.0–1.0)
pH: 7 (ref 5.0–8.0)

## 2024-06-24 LAB — MICROALBUMIN / CREATININE URINE RATIO
Creatinine,U: 164.8 mg/dL
Microalb Creat Ratio: 6.3 mg/g (ref 0.0–30.0)
Microalb, Ur: 1 mg/dL (ref 0.0–1.9)

## 2024-06-24 MED ORDER — LOSARTAN POTASSIUM-HCTZ 100-25 MG PO TABS
1.0000 | ORAL_TABLET | Freq: Every day | ORAL | 3 refills | Status: AC
Start: 2024-06-24 — End: ?

## 2024-06-24 MED ORDER — NAPROXEN SODIUM 220 MG PO TABS: 220.0000 mg | ORAL_TABLET | Freq: Two times a day (BID) | ORAL | Status: AC | PRN

## 2024-06-24 MED ORDER — CYANOCOBALAMIN 1000 MCG/ML IJ SOLN
1000.0000 ug | Freq: Once | INTRAMUSCULAR | Status: AC
  Administered 2024-06-24: 1000 ug via INTRAMUSCULAR

## 2024-06-24 MED ORDER — ATORVASTATIN CALCIUM 40 MG PO TABS: 40.0000 mg | ORAL_TABLET | Freq: Every day | ORAL | 3 refills | Status: AC

## 2024-06-24 NOTE — Assessment & Plan Note (Addendum)
 Deterioration noted over the past year, despite dropping spironolactone  dose from 50mg  to 25mg . Pt largely asxs.  Will stop spironolactone , recheck renal function in 1 month.  Check UA, Umicroalb/cr today  Order renal ultrasound.  RTC 6 wks OV.

## 2024-06-24 NOTE — Assessment & Plan Note (Signed)
 Preventative protocols reviewed and updated unless pt declined. Discussed healthy diet and lifestyle.

## 2024-06-24 NOTE — Assessment & Plan Note (Deleted)
Update UA.  

## 2024-06-24 NOTE — Assessment & Plan Note (Signed)
 Continue to encourage limiting added sugar in diet.

## 2024-06-24 NOTE — Assessment & Plan Note (Signed)
 B12 shot today then start daily b12 replacement

## 2024-06-24 NOTE — Assessment & Plan Note (Addendum)
 This limits activity Limited aleve  use.

## 2024-06-24 NOTE — Assessment & Plan Note (Addendum)
 Advanced directives - doesn't have this set up. HCPOA likely sister Montie Esters. Advanced directive packet provided today. Full code, ok with feeding tube. Wouldn't want prolonged life support if terminal condition.

## 2024-06-24 NOTE — Assessment & Plan Note (Signed)
No recent gout flare 

## 2024-06-24 NOTE — Assessment & Plan Note (Signed)
 Has been recommended 40 lb weight loss prior to considering orthopedic surgery.

## 2024-06-24 NOTE — Progress Notes (Signed)
 Ph: [phone] Fax: [phone]   Patient ID: Philip Middleton, male    DOB: 07-Jan-1956, 68 y.o.   MRN: [id number]  This visit was conducted in person.  BP 138/70   Pulse 98   Temp 99.5 F (37.5 C) (Oral)   Ht 5' 10 (1.778 m)   Wt (!) 331 lb 8 oz (150.4 kg)   SpO2 98%   BMI 47.57 kg/m   BP Readings from Last 3 Encounters:  06/24/24 138/70  09/04/23 (!) 148/90  06/04/23 (!) 162/84   CC: CPE Subjective:   HPI: Philip Middleton is a 68 y.o. male presenting on 06/24/2024 for Annual Exam   Saw health advisor 03/2024 for medicare wellness visit. Note reviewed.   No results found.  Flowsheet Row Office Visit from 06/24/2024 in [facility] [place]  PHQ-2 Total Score 0       06/24/2024   12:02 PM 03/26/2024    8:52 AM 03/26/2023    9:19 AM 03/23/2022   10:48 AM 03/21/2021    2:30 PM  Fall Risk   Falls in the past year? 0 0 0 0 0  Number falls in past yr:  1 0 0   Injury with Fall? 0 1 0 0   Risk for fall due to :  No Fall Risks No Fall Risks No Fall Risks   Follow up Falls evaluation completed Falls prevention discussed;Education provided Falls prevention discussed;Education provided      Known hip and knee osteoarthritis - has had knee and hip steroid shots Murrell, Ibazebo).   HTN - continues hyzaar 100/25mg  daily with spironolactone  25mg  daily.   H/o bilat DVTs with pulmonary embolism 2009, with residual chronic dyspnea and leg swelling.   Preventative: COLONOSCOPY 06/2022 - HP, int hem, rpt 5 yrs Ollen)  Prostate screening - maternal uncle with prostate cancer. Checks PSA yearly.  Lung cancer screening - not eligible  Flu shot yearly  COVID vaccine Pfizer 04/2020 x2 , no booster Td 2012  Prevnar-20 02/2021  Shingrix - discussed.  Advanced directives - doesn't have this set up. HCPOA likely sister Montie Esters. Advanced directive packet provided today. Full code, ok with feeding tube. Wouldn't want prolonged life support if  terminal condition.  Sleep - averaging 6 hours/night  Seat belt use discussed Sunscreen use discussed. No changing moles on skin  Non smoker  Alcohol - socially - not daily, max 4-5 drinks/week  Dentist - due.  Eye exam yearly - no glaucoma  Bowel - no constipation Bladder - no incontinence   Caffeine: 2-3 cups coffee/day   Lives alone, no pets, 1 child   Has sisters in [place] and [place]  Occupation: Press photographer, warehouse   Activity: no regular exercise  Diet: good water, daily fruits/vegetables     Relevant past medical, surgical, family and social history reviewed and updated as indicated. Interim medical history since our last visit reviewed. Allergies and medications reviewed and updated. Outpatient Medications Prior to Visit  Medication Sig Dispense Refill   aspirin  81 MG EC tablet Take 1 tablet (81 mg total) by mouth daily. Swallow whole.     cyanocobalamin  (VITAMIN B12) 1000 MCG tablet Take 1 tablet (1,000 mcg total) by mouth daily.     Multiple Vitamin (MULTIVITAMIN) tablet Take 1 tablet by mouth daily.     Omega-3 Fatty Acids (FISH OIL ) 1000 MG CAPS Take 1 capsule (1,000 mg total) by mouth daily.     sildenafil  (VIAGRA ) 100 MG  tablet Take 0.5-1 tablets (50-100 mg total) by mouth daily as needed for erectile dysfunction. 5 tablet 11   atorvastatin  (LIPITOR) 40 MG tablet TAKE 1 TABLET BY MOUTH DAILY AT 6 PM. 90 tablet 4   losartan -hydrochlorothiazide (HYZAAR) 100-25 MG tablet TAKE 1 TABLET BY MOUTH EVERY DAY 90 tablet 0   naproxen  sodium (ALEVE ) 220 MG tablet Take 2 tablets (440 mg total) by mouth daily as needed.     spironolactone  (ALDACTONE ) 25 MG tablet TAKE 1 TABLET (25 MG TOTAL) BY MOUTH DAILY. 30 tablet 1   No facility-administered medications prior to visit.     Per HPI unless specifically indicated in ROS section below Review of Systems  Constitutional:  Negative for activity change, appetite change, chills, fatigue, fever and unexpected weight change.  HENT:   Negative for hearing loss.   Eyes:  Negative for visual disturbance.  Respiratory:  Positive for cough (occ). Negative for chest tightness, shortness of breath and wheezing.   Cardiovascular:  Positive for leg swelling (chronic, overall improved). Negative for chest pain and palpitations.  Gastrointestinal:  Negative for abdominal distention, abdominal pain, blood in stool, constipation, diarrhea, nausea and vomiting.  Genitourinary:  Negative for difficulty urinating and hematuria.  Musculoskeletal:  Negative for arthralgias, myalgias and neck pain.  Skin:  Negative for rash.  Neurological:  Negative for dizziness, seizures, syncope and headaches.  Hematological:  Negative for adenopathy. Does not bruise/bleed easily.  Psychiatric/Behavioral:  Negative for dysphoric mood. The patient is not nervous/anxious.     Objective:  BP 138/70   Pulse 98   Temp 99.5 F (37.5 C) (Oral)   Ht 5' 10 (1.778 m)   Wt (!) 331 lb 8 oz (150.4 kg)   SpO2 98%   BMI 47.57 kg/m   Wt Readings from Last 3 Encounters:  06/24/24 (!) 331 lb 8 oz (150.4 kg)  03/26/24 (!) 340 lb (154.2 kg)  09/04/23 (!) 340 lb 2 oz (154.3 kg)      Physical Exam Vitals and nursing note reviewed.  Constitutional:      General: He is not in acute distress.    Appearance: Normal appearance. He is well-developed. He is not ill-appearing.  HENT:     Head: Normocephalic and atraumatic.     Right Ear: Hearing, tympanic membrane, ear canal and external ear normal.     Left Ear: Hearing, tympanic membrane, ear canal and external ear normal.     Mouth/Throat:     Mouth: Mucous membranes are moist.     Pharynx: Oropharynx is clear. No oropharyngeal exudate or posterior oropharyngeal erythema.  Eyes:     General: No scleral icterus.    Extraocular Movements: Extraocular movements intact.     Conjunctiva/sclera: Conjunctivae normal.     Pupils: Pupils are equal, round, and reactive to light.  Neck:     Thyroid : No thyroid  mass or  thyromegaly.     Vascular: No carotid bruit.  Cardiovascular:     Rate and Rhythm: Normal rate and regular rhythm.     Pulses: Normal pulses.          Radial pulses are 2+ on the right side and 2+ on the left side.     Heart sounds: Normal heart sounds. No murmur heard. Pulmonary:     Effort: Pulmonary effort is normal. No respiratory distress.     Breath sounds: Normal breath sounds. No wheezing, rhonchi or rales.  Abdominal:     General: Bowel sounds are normal. There is no  distension.     Palpations: Abdomen is soft. There is no mass.     Tenderness: There is no abdominal tenderness. There is no guarding or rebound.     Hernia: No hernia is present.  Musculoskeletal:        General: Normal range of motion.     Cervical back: Normal range of motion and neck supple.     Right lower leg: No edema.     Left lower leg: No edema.  Lymphadenopathy:     Cervical: No cervical adenopathy.  Skin:    General: Skin is warm and dry.     Findings: No rash.  Neurological:     General: No focal deficit present.     Mental Status: He is alert and oriented to person, place, and time.  Psychiatric:        Mood and Affect: Mood normal.        Behavior: Behavior normal.        Thought Content: Thought content normal.        Judgment: Judgment normal.       Results for orders placed or performed in visit on 06/17/24  PSA   Collection Time: 06/17/24  9:54 AM  Result Value Ref Range   PSA 1.00 0.10 - 4.00 ng/mL  Comprehensive metabolic panel with GFR   Collection Time: 06/17/24  9:54 AM  Result Value Ref Range   Sodium 141 135 - 145 mEq/L   Potassium 4.2 3.5 - 5.1 mEq/L   Chloride 102 96 - 112 mEq/L   CO2 30 19 - 32 mEq/L   Glucose, Bld 97 70 - 99 mg/dL   BUN 31 (H) 6 - 23 mg/dL   Creatinine, Ser 8.44 (H) 0.40 - 1.50 mg/dL   Total Bilirubin 0.5 0.2 - 1.2 mg/dL   Alkaline Phosphatase 57 39 - 117 U/L   AST 11 0 - 37 U/L   ALT 11 0 - 53 U/L   Total Protein 6.8 6.0 - 8.3 g/dL   Albumin  4.1 3.5 - 5.2 g/dL   GFR 54.60 (L) >39.99 mL/min   Calcium  9.7 8.4 - 10.5 mg/dL  Lipid panel   Collection Time: 06/17/24  9:54 AM  Result Value Ref Range   Cholesterol 183 0 - 200 mg/dL   Triglycerides 17.9 0.0 - 149.0 mg/dL   HDL 26.19 >60.99 mg/dL   VLDL 83.5 0.0 - 59.9 mg/dL   LDL Cholesterol 93 0 - 99 mg/dL   Total CHOL/HDL Ratio 2    NonHDL 109.39   Hemoglobin A1c   Collection Time: 06/17/24  9:54 AM  Result Value Ref Range   Hgb A1c MFr Bld 6.1 4.6 - 6.5 %  Vitamin B12   Collection Time: 06/17/24  9:54 AM  Result Value Ref Range   Vitamin B-12 212 211 - 911 pg/mL   Lab Results  Component Value Date   LABURIC 7.7 05/28/2023   Lab Results  Component Value Date   FOLATE 15.6 06/10/2023    Lab Results  Component Value Date   WBC 8.5 05/28/2023   HGB 13.6 05/28/2023   HCT 41.4 05/28/2023   MCV 101.5 (H) 05/28/2023   PLT 210.0 05/28/2023    Assessment & Plan:   Problem List Items Addressed This Visit     Healthcare maintenance - Primary (Chronic)   Preventative protocols reviewed and updated unless pt declined. Discussed healthy diet and lifestyle.       Advanced directives, counseling/discussion (Chronic)   Advanced  directives - doesn't have this set up. HCPOA likely sister Montie Esters. Advanced directive packet provided today. Full code, ok with feeding tube. Wouldn't want prolonged life support if terminal condition.       HLD (hyperlipidemia)   Chronic, stable. Continue statin. The 10-year ASCVD risk score (Arnett DK, et al., 2019) is: 18.4%   Values used to calculate the score:     Age: 81 years     Clincally relevant sex: Male     Is Non-Hispanic African American: Yes     Diabetic: No     Tobacco smoker: No     Systolic Blood Pressure: 138 mmHg     Is BP treated: Yes     HDL Cholesterol: 73.8 mg/dL     Total Cholesterol: 183 mg/dL       Relevant Medications   atorvastatin  (LIPITOR) 40 MG tablet   losartan -hydrochlorothiazide (HYZAAR)  100-25 MG tablet   Severe obesity (BMI >= 40) (HCC)   Has been recommended 40 lb weight loss prior to considering orthopedic surgery.       Essential hypertension   Chronic ,improved control on spironolactone  however with worsening renal function will stop spironolactone , continue Hyzaar 100/25mg  daily.  Consider lasix.  Avoiding amlodipine  in chronic pedal edema.       Relevant Medications   atorvastatin  (LIPITOR) 40 MG tablet   losartan -hydrochlorothiazide (HYZAAR) 100-25 MG tablet   History of pulmonary embolism   History of DVT (deep vein thrombosis)   Prediabetes   Continue to encourage limiting added sugar in diet.       Primary osteoarthritis of left hip   This limits activity Limited aleve  use.       Relevant Medications   naproxen  sodium (ALEVE ) 220 MG tablet   Podagra   No recent gout flare.       Relevant Medications   naproxen  sodium (ALEVE ) 220 MG tablet   Other Relevant Orders   Uric acid   Primary osteoarthritis of right knee   This limits activity      Relevant Medications   naproxen  sodium (ALEVE ) 220 MG tablet   Lymphedema   Vitamin B12 deficiency   B12 shot today then start daily b12 replacement      Chronic kidney disease, stage 3a (HCC)   Deterioration noted over the past year, despite dropping spironolactone  dose from 50mg  to 25mg . Pt largely asxs.  Will stop spironolactone , recheck renal function in 1 month.  Check UA, Umicroalb/cr today  Order renal ultrasound.  RTC 6 wks OV.       Relevant Orders   Microalbumin / creatinine urine ratio   Urinalysis, Routine w reflex microscopic   US  Renal   Renal function panel   Phosphorus   VITAMIN D 25 Hydroxy (Vit-D Deficiency, Fractures)   Parathyroid hormone, intact (no Ca)   CBC with Differential/Platelet     Meds ordered this encounter  Medications   atorvastatin  (LIPITOR) 40 MG tablet    Sig: Take 1 tablet (40 mg total) by mouth daily.    Dispense:  90 tablet    Refill:  3    losartan -hydrochlorothiazide (HYZAAR) 100-25 MG tablet    Sig: Take 1 tablet by mouth daily.    Dispense:  90 tablet    Refill:  3   naproxen  sodium (ALEVE ) 220 MG tablet    Sig: Take 1 tablet (220 mg total) by mouth 2 (two) times daily as needed.   cyanocobalamin  (VITAMIN B12) injection 1,000 mcg  Orders Placed This Encounter  Procedures   US  Renal    Standing Status:   Future    Expiration Date:   06/24/2025    Reason for Exam (SYMPTOM  OR DIAGNOSIS REQUIRED):   progressive kidney disease    Preferred imaging location?:   [facility]   Microalbumin / creatinine urine ratio   Urinalysis, Routine w reflex microscopic   Renal function panel    Standing Status:   Future    Expiration Date:   06/24/2025   Phosphorus    Standing Status:   Future    Expiration Date:   06/24/2025   VITAMIN D 25 Hydroxy (Vit-D Deficiency, Fractures)    Standing Status:   Future    Expiration Date:   06/24/2025   Parathyroid hormone, intact (no Ca)    Standing Status:   Future    Expiration Date:   06/24/2025   CBC with Differential/Platelet    Standing Status:   Future    Expiration Date:   06/24/2025   Uric acid    Standing Status:   Future    Expiration Date:   06/24/2025    Patient Instructions  Advanced packet provided today  B12 shot today then start daily b12 pills. Kidney function has worsened - hold spironolactone  for now, urine test today and I will order kidney ultrasound  Schedule repeat labs in 3-4 weeks to recheck kidneys off spironolactone .  Return in 6 weeks for follow up visit  Follow up plan: Return in about 6 weeks (around 08/05/2024) for follow up visit.  Anton Blas, MD

## 2024-06-24 NOTE — Assessment & Plan Note (Signed)
 Chronic, stable. Continue statin. The 10-year ASCVD risk score (Arnett DK, et al., 2019) is: 18.4%   Values used to calculate the score:     Age: 68 years     Clincally relevant sex: Male     Is Non-Hispanic African American: Yes     Diabetic: No     Tobacco smoker: No     Systolic Blood Pressure: 138 mmHg     Is BP treated: Yes     HDL Cholesterol: 73.8 mg/dL     Total Cholesterol: 183 mg/dL

## 2024-06-24 NOTE — Patient Instructions (Addendum)
 Advanced packet provided today  B12 shot today then start daily b12 pills. Kidney function has worsened - hold spironolactone  for now, urine test today and I will order kidney ultrasound  Schedule repeat labs in 3-4 weeks to recheck kidneys off spironolactone .  Return in 6 weeks for follow up visit

## 2024-06-24 NOTE — Assessment & Plan Note (Signed)
 This limits activity

## 2024-06-24 NOTE — Assessment & Plan Note (Signed)
 Chronic ,improved control on spironolactone  however with worsening renal function will stop spironolactone , continue Hyzaar 100/25mg  daily.  Consider lasix.  Avoiding amlodipine  in chronic pedal edema.

## 2024-06-25 ENCOUNTER — Ambulatory Visit
Admission: RE | Admit: 2024-06-25 | Discharge: 2024-06-25 | Disposition: A | Discharge status: Home / Self Care | Source: Ambulatory Visit | Attending: Family Medicine | Admitting: Family Medicine

## 2024-06-25 ENCOUNTER — Ambulatory Visit: Payer: Self-pay | Admitting: Family Medicine

## 2024-06-25 DIAGNOSIS — N189 Chronic kidney disease, unspecified: Secondary | ICD-10-CM | POA: Diagnosis not present

## 2024-06-25 DIAGNOSIS — N1831 Chronic kidney disease, stage 3a: Secondary | ICD-10-CM

## 2024-07-05 ENCOUNTER — Other Ambulatory Visit: Payer: Self-pay | Admitting: Family Medicine

## 2024-07-05 DIAGNOSIS — I10 Essential (primary) hypertension: Secondary | ICD-10-CM

## 2024-07-15 ENCOUNTER — Other Ambulatory Visit (INDEPENDENT_AMBULATORY_CARE_PROVIDER_SITE_OTHER)

## 2024-07-15 DIAGNOSIS — M109 Gout, unspecified: Secondary | ICD-10-CM | POA: Diagnosis not present

## 2024-07-15 DIAGNOSIS — N1831 Chronic kidney disease, stage 3a: Secondary | ICD-10-CM

## 2024-07-15 LAB — RENAL FUNCTION PANEL
Albumin: 4.1 g/dL (ref 3.5–5.2)
BUN: 21 mg/dL (ref 6–23)
CO2: 29 meq/L (ref 19–32)
Calcium: 9.3 mg/dL (ref 8.4–10.5)
Chloride: 103 meq/L (ref 96–112)
Creatinine, Ser: 1.34 mg/dL (ref 0.40–1.50)
GFR: 54.02 mL/min — ABNORMAL LOW (ref >60.00)
Glucose, Bld: 103 mg/dL — ABNORMAL HIGH (ref 70–99)
Phosphorus: 2.9 mg/dL (ref 2.3–4.6)
Potassium: 4.1 meq/L (ref 3.5–5.1)
Sodium: 142 meq/L (ref 135–145)

## 2024-07-15 LAB — CBC WITH DIFFERENTIAL/PLATELET
Basophils Absolute: 0 K/uL (ref 0.0–0.1)
Basophils Relative: 0.6 % (ref 0.0–3.0)
Eosinophils Absolute: 0.1 K/uL (ref 0.0–0.7)
Eosinophils Relative: 1 % (ref 0.0–5.0)
HCT: 41.4 % (ref 39.0–52.0)
Hemoglobin: 13.5 g/dL (ref 13.0–17.0)
Lymphocytes Relative: 34.9 % (ref 12.0–46.0)
Lymphs Abs: 2.5 K/uL (ref 0.7–4.0)
MCHC: 32.5 g/dL (ref 30.0–36.0)
MCV: 99.7 fl (ref 78.0–100.0)
Monocytes Absolute: 0.6 K/uL (ref 0.1–1.0)
Monocytes Relative: 8.5 % (ref 3.0–12.0)
Neutro Abs: 3.9 K/uL (ref 1.4–7.7)
Neutrophils Relative %: 55 % (ref 43.0–77.0)
Platelets: 195 K/uL (ref 150.0–400.0)
RBC: 4.15 Mil/uL — ABNORMAL LOW (ref 4.22–5.81)
RDW: 14.5 % (ref 11.5–15.5)
WBC: 7.2 K/uL (ref 4.0–10.5)

## 2024-07-15 LAB — VITAMIN D 25 HYDROXY (VIT D DEFICIENCY, FRACTURES): VITD: 20.2 ng/mL — ABNORMAL LOW (ref 30.00–100.00)

## 2024-07-15 LAB — PHOSPHORUS: Phosphorus: 2.9 mg/dL (ref 2.3–4.6)

## 2024-07-15 LAB — URIC ACID: Uric Acid, Serum: 9.1 mg/dL — ABNORMAL HIGH (ref 4.0–7.8)

## 2024-07-16 LAB — PARATHYROID HORMONE, INTACT (NO CA): PTH: 60 pg/mL (ref 16–77)

## 2024-08-10 ENCOUNTER — Ambulatory Visit (INDEPENDENT_AMBULATORY_CARE_PROVIDER_SITE_OTHER): Admitting: Family Medicine

## 2024-08-10 ENCOUNTER — Encounter: Payer: Self-pay | Admitting: Family Medicine

## 2024-08-10 VITALS — BP 132/82 | HR 68 | Temp 98.8°F | Ht 70.0 in | Wt 329.2 lb

## 2024-08-10 DIAGNOSIS — N1831 Chronic kidney disease, stage 3a: Secondary | ICD-10-CM

## 2024-08-10 DIAGNOSIS — E538 Deficiency of other specified B group vitamins: Secondary | ICD-10-CM

## 2024-08-10 DIAGNOSIS — R0609 Other forms of dyspnea: Secondary | ICD-10-CM | POA: Diagnosis not present

## 2024-08-10 DIAGNOSIS — I10 Essential (primary) hypertension: Secondary | ICD-10-CM | POA: Diagnosis not present

## 2024-08-10 DIAGNOSIS — M109 Gout, unspecified: Secondary | ICD-10-CM | POA: Diagnosis not present

## 2024-08-10 DIAGNOSIS — Z23 Encounter for immunization: Secondary | ICD-10-CM

## 2024-08-10 DIAGNOSIS — E559 Vitamin D deficiency, unspecified: Secondary | ICD-10-CM | POA: Diagnosis not present

## 2024-08-10 MED ORDER — VITAMIN D3 25 MCG (1000 UT) PO CAPS: 1.0000 | ORAL_CAPSULE | Freq: Every day | ORAL | Status: AC

## 2024-08-10 NOTE — Assessment & Plan Note (Signed)
 ESS low risk however STOPBANG high risk for OSA.  Neck circ 48cm.  Reviewed risks of untreated sleep apnea - will order HST through [facility].

## 2024-08-10 NOTE — Assessment & Plan Note (Signed)
 Reviewed latest dx with patient, handout on CKD provided.  Will continue to monitor.

## 2024-08-10 NOTE — Assessment & Plan Note (Addendum)
 Update HST as per above. Has not undergone PFTs.

## 2024-08-10 NOTE — Assessment & Plan Note (Signed)
 Reviewed higher urate levels.  Discussed if recurrent gout flares to start daily ppx medication (allopurinol).

## 2024-08-10 NOTE — Patient Instructions (Addendum)
 Flu shot today  I recommend you buy BP cuff with extra large arm cuff to monitor blood pressures at home.  Start vitamin D3 1000 units daily over the counter Let me know if any recurrent gout flares to consider daily gout lowering medicine I will order home sleep study through [facility].  Good to see you today Return in 4 months for follow up visit - repeat labs at that time

## 2024-08-10 NOTE — Progress Notes (Signed)
 Ph: [phone] Fax: [phone]   Patient ID: Philip Middleton, male    DOB: 12/28/1955, 68 y.o.   MRN: [id number]  This visit was conducted in person.  BP 132/82   Pulse 68   Temp 98.8 F (37.1 C) (Oral)   Ht 5' 10 (1.778 m)   Wt (!) 329 lb 4 oz (149.3 kg)   SpO2 99%   BMI 47.24 kg/m   BP Readings from Last 3 Encounters:  08/10/24 132/82  06/24/24 138/70  09/04/23 (!) 148/90    CC: 6 wk f/u visit  Subjective:   HPI: Philip Middleton is a 68 y.o. male presenting on 08/10/2024 for Medical Management of Chronic Issues (Pt here for 6 wk f/u)   HTN - Compliant with current antihypertensive regimen of hyzaar 100/25mg  daily. H/o Does not check blood pressures at home. No low blood pressure readings or symptoms of dizziness/syncope. Denies HA, vision changes, CP/tightness, SOB, leg swelling.   Avoiding amlodipine  in chronic pedal edema.  AKI with spironolactone  - now off this.   H/o bilat DVTs with PE 2009, with residual chronic dyspnea and leg swelling.   Renal US  06/2024 - no significant abnormality noted.   Sleep - overall restorative sleep. No daytime somnolence. No morning headaches. + snoring. No witnessed apnea or PNdyspnea. No h/o OSA.  ESS = 3 STOP-BANG risk assessment = 6 - HIGH RISK     Relevant past medical, surgical, family and social history reviewed and updated as indicated. Interim medical history since our last visit reviewed. Allergies and medications reviewed and updated. Outpatient Medications Prior to Visit  Medication Sig Dispense Refill   aspirin  81 MG EC tablet Take 1 tablet (81 mg total) by mouth daily. Swallow whole.     atorvastatin  (LIPITOR) 40 MG tablet Take 1 tablet (40 mg total) by mouth daily. 90 tablet 3   cyanocobalamin  (VITAMIN B12) 1000 MCG tablet Take 1 tablet (1,000 mcg total) by mouth daily.     losartan -hydrochlorothiazide (HYZAAR) 100-25 MG tablet Take 1 tablet by mouth daily. 90 tablet 3   Multiple Vitamin (MULTIVITAMIN)  tablet Take 1 tablet by mouth daily.     naproxen  sodium (ALEVE ) 220 MG tablet Take 1 tablet (220 mg total) by mouth 2 (two) times daily as needed.     Omega-3 Fatty Acids (FISH OIL ) 1000 MG CAPS Take 1 capsule (1,000 mg total) by mouth daily.     sildenafil  (VIAGRA ) 100 MG tablet Take 0.5-1 tablets (50-100 mg total) by mouth daily as needed for erectile dysfunction. 5 tablet 11   No facility-administered medications prior to visit.     Per HPI unless specifically indicated in ROS section below Review of Systems  Objective:  BP 132/82   Pulse 68   Temp 98.8 F (37.1 C) (Oral)   Ht 5' 10 (1.778 m)   Wt (!) 329 lb 4 oz (149.3 kg)   SpO2 99%   BMI 47.24 kg/m   Wt Readings from Last 3 Encounters:  08/10/24 (!) 329 lb 4 oz (149.3 kg)  06/24/24 (!) 331 lb 8 oz (150.4 kg)  03/26/24 (!) 340 lb (154.2 kg)      Physical Exam Vitals and nursing note reviewed.  Constitutional:      Appearance: Normal appearance. He is obese. He is not ill-appearing.  HENT:     Head: Normocephalic and atraumatic.     Mouth/Throat:     Mouth: Mucous membranes are moist.     Pharynx: Oropharynx is  clear. No oropharyngeal exudate or posterior oropharyngeal erythema.     Comments: Crowded oropharynx Eyes:     Extraocular Movements: Extraocular movements intact.     Conjunctiva/sclera: Conjunctivae normal.     Pupils: Pupils are equal, round, and reactive to light.  Neck:     Comments:  Neck circ 48 cm  Cardiovascular:     Rate and Rhythm: Normal rate and regular rhythm.     Pulses: Normal pulses.     Heart sounds: Normal heart sounds. No murmur heard. Pulmonary:     Effort: Pulmonary effort is normal. No respiratory distress.     Breath sounds: Normal breath sounds. No wheezing, rhonchi or rales.  Musculoskeletal:     Cervical back: Normal range of motion and neck supple.     Right lower leg: No edema.     Left lower leg: No edema.     Comments: Chronic nonpitting lower leg edema  Skin:     General: Skin is warm and dry.  Neurological:     Mental Status: He is alert.  Psychiatric:        Mood and Affect: Mood normal.        Behavior: Behavior normal.       Results for orders placed or performed in visit on 07/15/24  Uric acid   Collection Time: 07/15/24  9:42 AM  Result Value Ref Range   Uric Acid, Serum 9.1 (H) 4.0 - 7.8 mg/dL  CBC with Differential/Platelet   Collection Time: 07/15/24  9:42 AM  Result Value Ref Range   WBC 7.2 4.0 - 10.5 K/uL   RBC 4.15 (L) 4.22 - 5.81 Mil/uL   Hemoglobin 13.5 13.0 - 17.0 g/dL   HCT 58.5 60.9 - 47.9 %   MCV 99.7 78.0 - 100.0 fl   MCHC 32.5 30.0 - 36.0 g/dL   RDW 85.4 88.4 - 84.4 %   Platelets 195.0 150.0 - 400.0 K/uL   Neutrophils Relative % 55.0 43.0 - 77.0 %   Lymphocytes Relative 34.9 12.0 - 46.0 %   Monocytes Relative 8.5 3.0 - 12.0 %   Eosinophils Relative 1.0 0.0 - 5.0 %   Basophils Relative 0.6 0.0 - 3.0 %   Neutro Abs 3.9 1.4 - 7.7 K/uL   Lymphs Abs 2.5 0.7 - 4.0 K/uL   Monocytes Absolute 0.6 0.1 - 1.0 K/uL   Eosinophils Absolute 0.1 0.0 - 0.7 K/uL   Basophils Absolute 0.0 0.0 - 0.1 K/uL  Parathyroid  hormone, intact (no Ca)   Collection Time: 07/15/24  9:42 AM  Result Value Ref Range   PTH 60 16 - 77 pg/mL  VITAMIN D  25 Hydroxy (Vit-D Deficiency, Fractures)   Collection Time: 07/15/24  9:42 AM  Result Value Ref Range   VITD 20.20 (L) 30.00 - 100.00 ng/mL  Phosphorus   Collection Time: 07/15/24  9:42 AM  Result Value Ref Range   Phosphorus 2.9 2.3 - 4.6 mg/dL  Renal function panel   Collection Time: 07/15/24  9:42 AM  Result Value Ref Range   Sodium 142 135 - 145 mEq/L   Potassium 4.1 3.5 - 5.1 mEq/L   Chloride 103 96 - 112 mEq/L   CO2 29 19 - 32 mEq/L   Albumin 4.1 3.5 - 5.2 g/dL   BUN 21 6 - 23 mg/dL   Creatinine, Ser 8.65 0.40 - 1.50 mg/dL   Glucose, Bld 896 (H) 70 - 99 mg/dL   Phosphorus 2.9 2.3 - 4.6 mg/dL   GFR 45.97 (L) >  60.00 mL/min   Calcium  9.3 8.4 - 10.5 mg/dL   Lab Results  Component  Value Date   VITAMINB12 212 06/17/2024   Echocardiogram 10/2022: LVEF 55-60%, normal wall motion, mild LVH, G1DD, LA mod dilated, MV/AV normal, unable to assess PA pressure.   Assessment & Plan:   Problem List Items Addressed This Visit     Severe obesity (BMI >= 40) (HCC)   ESS low risk however STOPBANG high risk for OSA.  Neck circ 48cm.  Reviewed risks of untreated sleep apnea - will order HST through [facility].       Essential hypertension - Primary   Chronic, stable on current regimen of hyzaar alone - now off spironolactone .  Will continue current regimen, reassess at 2mo f/u visit.       Podagra   Reviewed higher urate levels.  Discussed if recurrent gout flares to start daily ppx medication (allopurinol).       Chronic dyspnea   Update HST as per above. Has not undergone PFTs.       Vitamin B12 deficiency   Continue b12 replacement.       Chronic kidney disease, stage 3a [facility])   Reviewed latest dx with patient, handout on CKD provided.  Will continue to monitor.       Vitamin D  deficiency   Start 1000 units daily.       Other Visit Diagnoses       Encounter for immunization       Relevant Orders   Flu vaccine HIGH DOSE PF(Fluzone Trivalent) (Completed)        Meds ordered this encounter  Medications   Cholecalciferol (VITAMIN D3) 25 MCG (1000 UT) CAPS    Sig: Take 1 capsule (1,000 Units total) by mouth daily.    Orders Placed This Encounter  Procedures   Flu vaccine HIGH DOSE PF(Fluzone Trivalent)    Patient Instructions  Flu shot today  I recommend you buy BP cuff with extra large arm cuff to monitor blood pressures at home.  Start vitamin D3 1000 units daily over the counter Let me know if any recurrent gout flares to consider daily gout lowering medicine I will order home sleep study through [facility].  Good to see you today Return in 4 months for follow up visit - repeat labs at that time  Follow up plan: Return in  about 4 months (around 12/10/2024), or if symptoms worsen or fail to improve, for follow up visit.  Anton Blas, MD

## 2024-08-10 NOTE — Assessment & Plan Note (Signed)
Continue b12 replacement.  

## 2024-08-10 NOTE — Assessment & Plan Note (Signed)
Start 1000 units daily

## 2024-08-10 NOTE — Assessment & Plan Note (Signed)
 Chronic, stable on current regimen of hyzaar alone - now off spironolactone .  Will continue current regimen, reassess at 48mo f/u visit.

## 2024-08-11 ENCOUNTER — Telehealth: Payer: Self-pay

## 2024-08-11 DIAGNOSIS — G4733 Obstructive sleep apnea (adult) (pediatric): Secondary | ICD-10-CM

## 2024-08-11 DIAGNOSIS — Z86711 Personal history of pulmonary embolism: Secondary | ICD-10-CM

## 2024-08-11 DIAGNOSIS — R0609 Other forms of dyspnea: Secondary | ICD-10-CM

## 2024-08-18 DIAGNOSIS — G4730 Sleep apnea, unspecified: Secondary | ICD-10-CM | POA: Diagnosis not present

## 2024-08-31 ENCOUNTER — Telehealth: Payer: Self-pay

## 2024-08-31 NOTE — Telephone Encounter (Signed)
 Copied from CRM #[id number]. Topic: Clinical - Medication Question >> Aug 31, 2024 10:28 AM Charolett L wrote: Reason for CRM: Patient is requesting that medication be called in for his gout pain

## 2024-08-31 NOTE — Telephone Encounter (Signed)
 Called and spoke with patient advised patient will need appt for evaluation and treatment. Pt declined appt at this time, but will call back if he changes his mind.

## 2024-09-16 DIAGNOSIS — G4733 Obstructive sleep apnea (adult) (pediatric): Secondary | ICD-10-CM | POA: Insufficient documentation

## 2024-09-16 DIAGNOSIS — M1711 Unilateral primary osteoarthritis, right knee: Secondary | ICD-10-CM | POA: Diagnosis not present

## 2024-09-16 NOTE — Telephone Encounter (Signed)
 Called patient reviewed all information and repeated back to me. Will call if any questions.  He will let us  know if no call to set up appointment within 2 weeks.

## 2024-09-16 NOTE — Addendum Note (Signed)
 Addended by: RILLA BALLER on: 09/16/2024 07:55 AM   Modules accepted: Orders

## 2024-09-16 NOTE — Telephone Encounter (Addendum)
 Was he having an acute gout attack, or does he want to try daily preventative gout medication (we discussed this at last OV)?

## 2024-09-16 NOTE — Telephone Encounter (Signed)
 Please notify home sleep test returned showing evidence of severe sleep apnea with both central and obstructive component. Given severity of sleep apnea, do recommend evaluation by sleep doctor - I have placed referral to [facility] pulmonology in [place] to discuss CPAP treatment.   HST 08/2024: severe OSA as evidenced by AHI (4%) 51.2, RDI (3%) 69.9, total 234 apneas 79 which were central. O2 nadir 70% (5% time)

## 2024-09-16 NOTE — Telephone Encounter (Signed)
 Called patient it was a flair and he has had improvement of symptoms.

## 2024-09-18 DIAGNOSIS — M1612 Unilateral primary osteoarthritis, left hip: Secondary | ICD-10-CM | POA: Diagnosis not present

## 2024-09-28 ENCOUNTER — Encounter: Payer: Self-pay | Admitting: Family Medicine

## 2024-12-11 ENCOUNTER — Encounter: Payer: Self-pay | Admitting: Family Medicine

## 2024-12-11 ENCOUNTER — Ambulatory Visit: Admitting: Family Medicine

## 2024-12-11 VITALS — BP 140/98 | HR 70 | Temp 97.9°F | Ht 70.0 in | Wt 327.9 lb

## 2024-12-11 DIAGNOSIS — I890 Lymphedema, not elsewhere classified: Secondary | ICD-10-CM | POA: Diagnosis not present

## 2024-12-11 DIAGNOSIS — G4733 Obstructive sleep apnea (adult) (pediatric): Secondary | ICD-10-CM

## 2024-12-11 DIAGNOSIS — Z86711 Personal history of pulmonary embolism: Secondary | ICD-10-CM | POA: Diagnosis not present

## 2024-12-11 DIAGNOSIS — M109 Gout, unspecified: Secondary | ICD-10-CM | POA: Diagnosis not present

## 2024-12-11 DIAGNOSIS — I10 Essential (primary) hypertension: Secondary | ICD-10-CM

## 2024-12-11 MED ORDER — COLCHICINE 0.6 MG PO TABS: ORAL_TABLET | ORAL | 1 refills | Status: AC

## 2024-12-11 NOTE — Progress Notes (Signed)
 " Ph: [phone] Fax: [phone]   Patient ID: Philip Middleton, male    DOB: [birth year], 69 y.o.   MRN: [id number]  This visit was conducted in person.  BP (!) 140/98 (BP Location: Left Arm, Patient Position: Sitting, Cuff Size: Large)   Pulse 70   Temp 97.9 F (36.6 C) (Oral)   Ht 5' 10 (1.778 m)   Wt (!) 327 lb 14.4 oz (148.7 kg)   SpO2 95%   BMI 47.05 kg/m   150s/90s on repeat   CC: HTN f/u visit  Subjective:   HPI: Philip Middleton is a 69 y.o. male presenting on 12/11/2024 for Medical Management of Chronic Issues (Pt has SOB upon ambulation/No bp log, pt states he eats less that he used to but has also decreased activity levels)   HTN - Compliant with current antihypertensive regimen of hyzaar 100/25mg  daily. Does not check blood pressures at home. No low blood pressure readings or symptoms of dizziness/syncope. Denies HA, vision changes, CP/tightness. Known chronic dyspnea, leg swelling L>R AKI with spironolactone  - now off this.  Avoiding amlodipine  in chronic pedal edema.  H/o bilat DVTs with PE 2009, with residual chronic dyspnea and leg swelling.   HST 08/2024: severe OSA as evidenced by AHI (4%) 51.2, RDI (3%) 69.9, total 234 apneas 79 which were central. O2 nadir 70% (5% time)  Needs to schedule pulm f/u - # provided.   Gout attack L foot (to ankle) 08/2024, worst it's been. Treated with aleve  with relief. Had trouble getting through.      Relevant past medical, surgical, family and social history reviewed and updated as indicated. Interim medical history since our last visit reviewed. Allergies and medications reviewed and updated. Outpatient Medications Prior to Visit  Medication Sig Dispense Refill   aspirin  81 MG EC tablet Take 1 tablet (81 mg total) by mouth daily. Swallow whole.     atorvastatin  (LIPITOR) 40 MG tablet Take 1 tablet (40 mg total) by mouth daily. 90 tablet 3   Cholecalciferol (VITAMIN D3) 25 MCG (1000 UT) CAPS Take 1 capsule (1,000  Units total) by mouth daily.     cyanocobalamin  (VITAMIN B12) 1000 MCG tablet Take 1 tablet (1,000 mcg total) by mouth daily.     losartan -hydrochlorothiazide (HYZAAR) 100-25 MG tablet Take 1 tablet by mouth daily. 90 tablet 3   Multiple Vitamin (MULTIVITAMIN) tablet Take 1 tablet by mouth daily.     naproxen  sodium (ALEVE ) 220 MG tablet Take 1 tablet (220 mg total) by mouth 2 (two) times daily as needed.     Omega-3 Fatty Acids (FISH OIL ) 1000 MG CAPS Take 1 capsule (1,000 mg total) by mouth daily. (Patient taking differently: Take 1 capsule by mouth as needed.)     sildenafil  (VIAGRA ) 100 MG tablet Take 0.5-1 tablets (50-100 mg total) by mouth daily as needed for erectile dysfunction. (Patient not taking: Reported on 12/11/2024) 5 tablet 11   No facility-administered medications prior to visit.     Per HPI unless specifically indicated in ROS section below Review of Systems  Objective:  BP (!) 140/98 (BP Location: Left Arm, Patient Position: Sitting, Cuff Size: Large)   Pulse 70   Temp 97.9 F (36.6 C) (Oral)   Ht 5' 10 (1.778 m)   Wt (!) 327 lb 14.4 oz (148.7 kg)   SpO2 95%   BMI 47.05 kg/m   Wt Readings from Last 3 Encounters:  12/11/24 (!) 327 lb 14.4 oz (148.7 kg)  08/10/24 ROLLEN)  329 lb 4 oz (149.3 kg)  06/24/24 (!) 331 lb 8 oz (150.4 kg)      Physical Exam Vitals and nursing note reviewed.  Constitutional:      Appearance: Normal appearance. He is not ill-appearing.     Comments: Ambulates with cane   HENT:     Head: Normocephalic and atraumatic.     Mouth/Throat:     Mouth: Mucous membranes are moist.     Pharynx: Oropharynx is clear. No oropharyngeal exudate or posterior oropharyngeal erythema.  Eyes:     Extraocular Movements: Extraocular movements intact.     Pupils: Pupils are equal, round, and reactive to light.  Neck:     Thyroid : No thyroid  mass or thyromegaly.  Cardiovascular:     Rate and Rhythm: Normal rate and regular rhythm.     Pulses: Normal pulses.      Heart sounds: Normal heart sounds. No murmur heard. Pulmonary:     Effort: Pulmonary effort is normal. No respiratory distress.     Breath sounds: Normal breath sounds. No wheezing, rhonchi or rales.  Musculoskeletal:        General: Swelling present.     Cervical back: Normal range of motion and neck supple. No rigidity.     Right lower leg: Edema present.     Left lower leg: Edema present.     Comments: Chronic lymphedema L>R legs  Lymphadenopathy:     Cervical: No cervical adenopathy.  Skin:    General: Skin is warm and dry.     Findings: No rash.  Neurological:     Mental Status: He is alert.  Psychiatric:        Mood and Affect: Mood normal.        Behavior: Behavior normal.       Results for orders placed or performed in visit on 07/15/24  Uric acid   Collection Time: 07/15/24  9:42 AM  Result Value Ref Range   Uric Acid, Serum 9.1 (H) 4.0 - 7.8 mg/dL  CBC with Differential/Platelet   Collection Time: 07/15/24  9:42 AM  Result Value Ref Range   WBC 7.2 4.0 - 10.5 K/uL   RBC 4.15 (L) 4.22 - 5.81 Mil/uL   Hemoglobin 13.5 13.0 - 17.0 g/dL   HCT 58.5 60.9 - 47.9 %   MCV 99.7 78.0 - 100.0 fl   MCHC 32.5 30.0 - 36.0 g/dL   RDW 85.4 88.4 - 84.4 %   Platelets 195.0 150.0 - 400.0 K/uL   Neutrophils Relative % 55.0 43.0 - 77.0 %   Lymphocytes Relative 34.9 12.0 - 46.0 %   Monocytes Relative 8.5 3.0 - 12.0 %   Eosinophils Relative 1.0 0.0 - 5.0 %   Basophils Relative 0.6 0.0 - 3.0 %   Neutro Abs 3.9 1.4 - 7.7 K/uL   Lymphs Abs 2.5 0.7 - 4.0 K/uL   Monocytes Absolute 0.6 0.1 - 1.0 K/uL   Eosinophils Absolute 0.1 0.0 - 0.7 K/uL   Basophils Absolute 0.0 0.0 - 0.1 K/uL  Parathyroid  hormone, intact (no Ca)   Collection Time: 07/15/24  9:42 AM  Result Value Ref Range   PTH 60 16 - 77 pg/mL  VITAMIN D  25 Hydroxy (Vit-D Deficiency, Fractures)   Collection Time: 07/15/24  9:42 AM  Result Value Ref Range   VITD 20.20 (L) 30.00 - 100.00 ng/mL  Phosphorus   Collection  Time: 07/15/24  9:42 AM  Result Value Ref Range   Phosphorus 2.9 2.3 - 4.6 mg/dL  Renal function panel   Collection Time: 07/15/24  9:42 AM  Result Value Ref Range   Sodium 142 135 - 145 mEq/L   Potassium 4.1 3.5 - 5.1 mEq/L   Chloride 103 96 - 112 mEq/L   CO2 29 19 - 32 mEq/L   Albumin 4.1 3.5 - 5.2 g/dL   BUN 21 6 - 23 mg/dL   Creatinine, Ser 8.65 0.40 - 1.50 mg/dL   Glucose, Bld 896 (H) 70 - 99 mg/dL   Phosphorus 2.9 2.3 - 4.6 mg/dL   GFR 45.97 (L) >39.99 mL/min   Calcium  9.3 8.4 - 10.5 mg/dL   Echocardiogram 87/7976: LVEF 55-60%, normal wall motion, mild LVH, G1DD, LA mod dilated, MV/AV normal, unable to assess PA pressure.   Renal US  06/2024: no significant abnormality noted.   Assessment & Plan:  He requests MyChart deactivation as he doesn't use this.  Problem List Items Addressed This Visit     Severe obesity (BMI >= 40) (HCC)   Essential hypertension - Primary   Chronic, BP today elevated in setting of not taking med yet today.  Recommend buy home BP arm cuff size XL to keep track of blood pressures at home.  No med changes today - as anticipate OSA treatment may also help control blood pressures.       History of pulmonary embolism   Only on aspirin  81mg  daily preventatively. Completed negative hypercoagulability evaluation 09/2008.       Podagra   Not frequent. Latest episode 08/2024 treated with aleve . Rx colchicine  with instructions on its use  Discussed thiazide effect on urate - will monitor for recurrence, consider med change including daily allopurinol commencement.  Declines for now.       Relevant Medications   colchicine  0.6 MG tablet   Lymphedema   Trouble getting him into OT lymphedema clinic for evaluation 2024 - consider reattempt.      OSA (obstructive sleep apnea)   Severe on recent HST.  Pending pulm evaluation / OSA treatment commencement  # provided for him to schedule appt.         Meds ordered this encounter  Medications    colchicine  0.6 MG tablet    Sig: Take one tablet daily as needed for gout flares, on first day of attack may take 2 tablets    Dispense:  30 tablet    Refill:  1    No orders of the defined types were placed in this encounter.   Patient Instructions  Call sleep doctors for appointment:  [facility] Desk [phone] [place]  Your goal blood pressure is <140/90. BP was elevated today. Buy arm cuff with X-large size to monitor blood pressures at home. BP log sheet provided today.  Work on low salt/sodium diet - goal <2 grams (2,000mg ) per day. Eat a diet high in fruits/vegetables and whole grains.  Look into mediterranean and DASH diet. Goal activity is 177min/wk of moderate intensity exercise.  This can be split into 30 minute chunks.  If you are not at this level, you can start with smaller 10-15 min increments and slowly build up activity. Look at www.heart.org for more resources   Return in 3 months for hypertension follow up visit   Follow up plan: Return if symptoms worsen or fail to improve.  Anton Blas, MD   "

## 2024-12-11 NOTE — Assessment & Plan Note (Addendum)
 Trouble getting him into OT lymphedema clinic for evaluation 2024 - consider reattempt.

## 2024-12-11 NOTE — Assessment & Plan Note (Signed)
 Only on aspirin  81mg  daily preventatively. Completed negative hypercoagulability evaluation 09/2008.

## 2024-12-11 NOTE — Patient Instructions (Addendum)
 Call sleep doctors for appointment:  [facility] Desk [phone] [place]  Your goal blood pressure is <140/90. BP was elevated today. Buy arm cuff with X-large size to monitor blood pressures at home. BP log sheet provided today.  Work on low salt/sodium diet - goal <2 grams (2,000mg ) per day. Eat a diet high in fruits/vegetables and whole grains.  Look into mediterranean and DASH diet. Goal activity is 133min/wk of moderate intensity exercise.  This can be split into 30 minute chunks.  If you are not at this level, you can start with smaller 10-15 min increments and slowly build up activity. Look at www.heart.org for more resources   Return in 3 months for hypertension follow up visit

## 2024-12-11 NOTE — Assessment & Plan Note (Addendum)
 Not frequent. Latest episode 08/2024 treated with aleve . Rx colchicine  with instructions on its use  Discussed thiazide effect on urate - will monitor for recurrence, consider med change including daily allopurinol commencement.  Declines for now.

## 2024-12-11 NOTE — Assessment & Plan Note (Signed)
 Severe on recent HST.  Pending pulm evaluation / OSA treatment commencement  # provided for him to schedule appt.

## 2024-12-11 NOTE — Assessment & Plan Note (Signed)
 Chronic, BP today elevated in setting of not taking med yet today.  Recommend buy home BP arm cuff size XL to keep track of blood pressures at home.  No med changes today - as anticipate OSA treatment may also help control blood pressures.

## 2025-03-12 ENCOUNTER — Ambulatory Visit: Admitting: Family Medicine

## 2025-03-30 ENCOUNTER — Ambulatory Visit
